# Patient Record
Sex: Female | Born: 2000 | Race: Black or African American | Hispanic: No | Smoking: Former smoker
Health system: Southern US, Community
[De-identification: ages and names within clinical notes are randomized; demographics above are authoritative.]

## PROBLEM LIST (undated history)

## (undated) DIAGNOSIS — R030 Elevated blood-pressure reading, without diagnosis of hypertension: Secondary | ICD-10-CM

## (undated) DIAGNOSIS — Z789 Other specified health status: Secondary | ICD-10-CM

## (undated) HISTORY — DX: Elevated blood-pressure reading, without diagnosis of hypertension: R03.0

## (undated) HISTORY — DX: Other specified health status: Z78.9

## (undated) HISTORY — PX: OTHER SURGICAL HISTORY: SHX169

---
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
## 2020-03-05 ENCOUNTER — Ambulatory Visit: Payer: Medicaid Other

## 2020-04-13 IMAGING — CR DG CHEST 2V
2 series · 2 of 2 positions shown · non-contrast
Comparison: None.

CLINICAL DATA: Chest pain

EXAM:
CHEST - 2 VIEW

[chest pa]
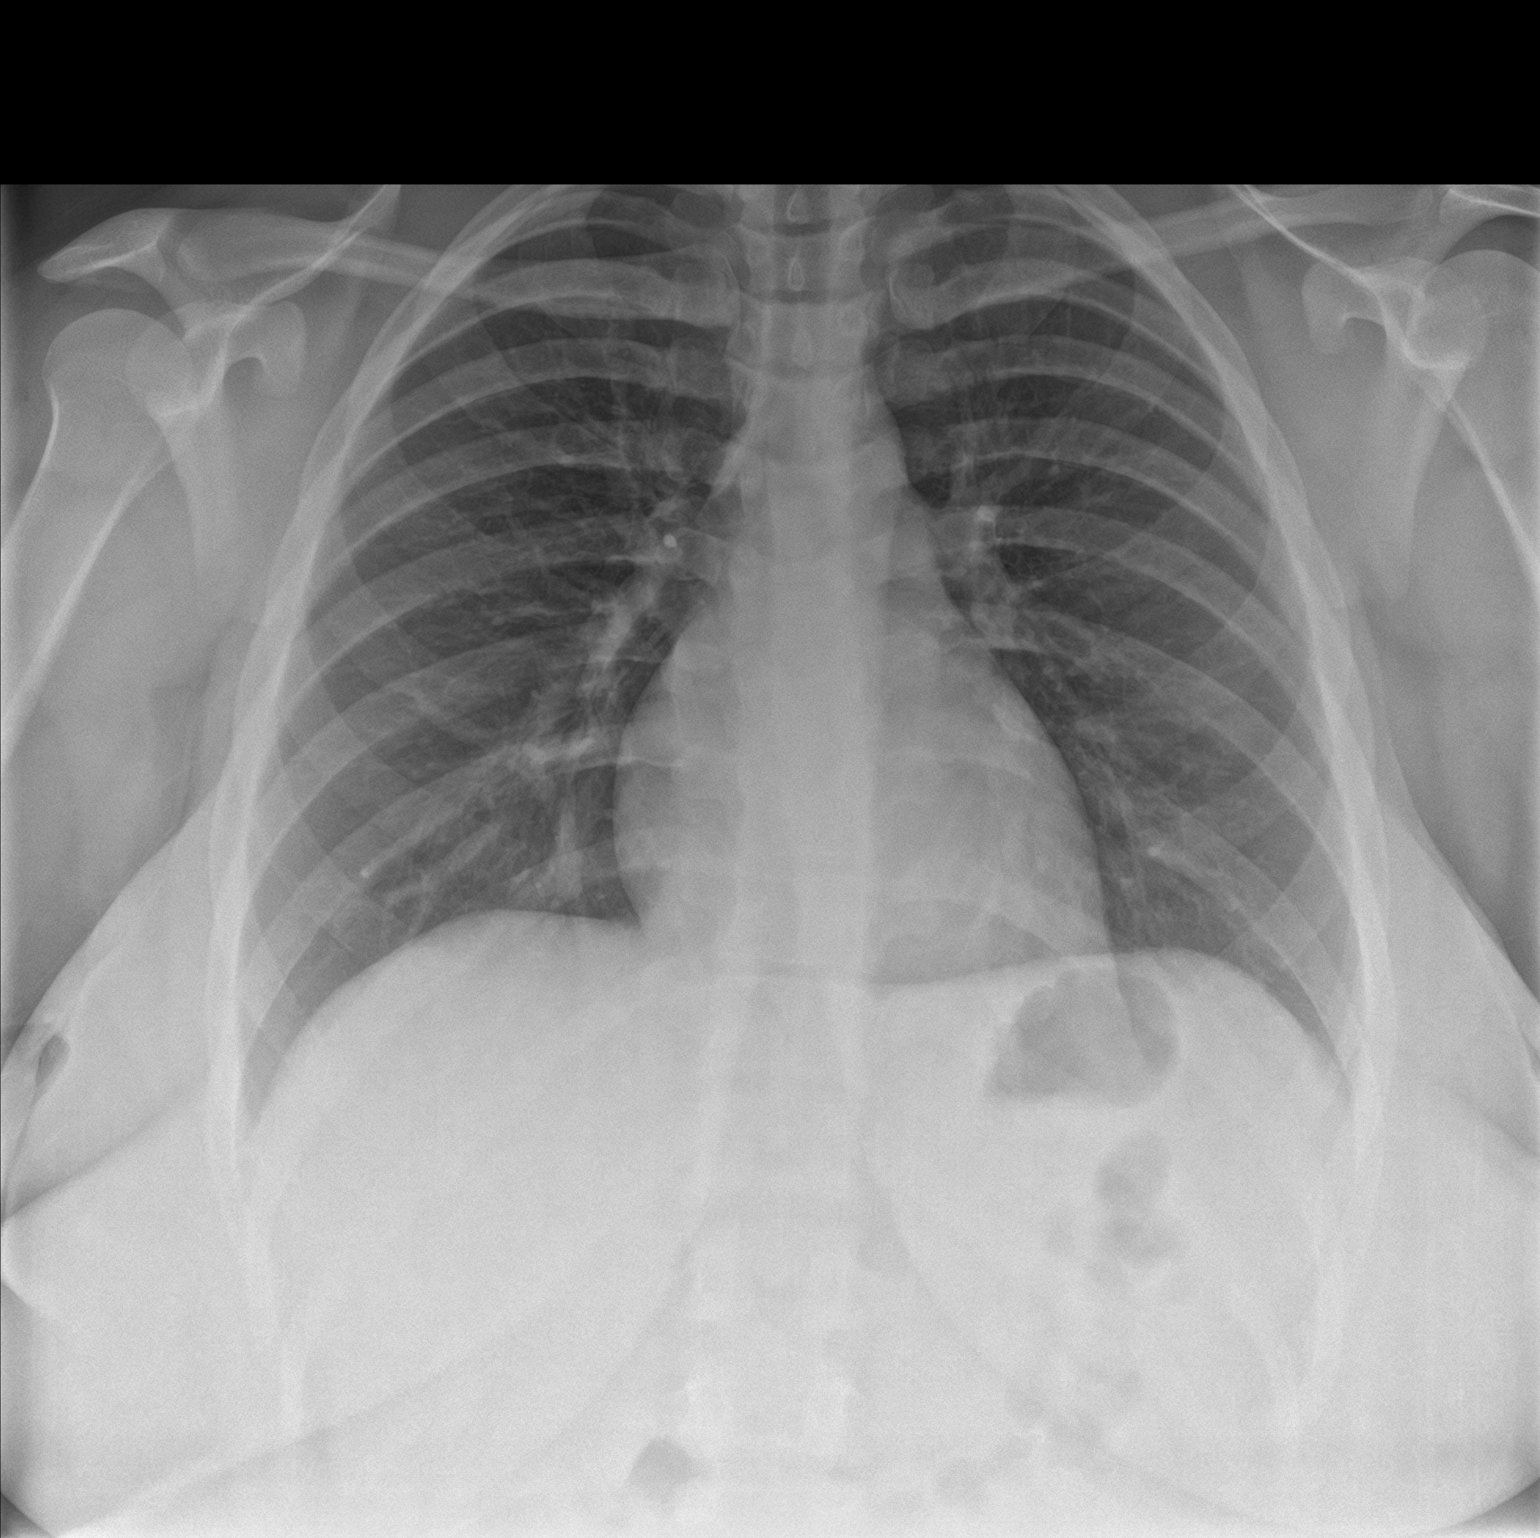

[chest lat]
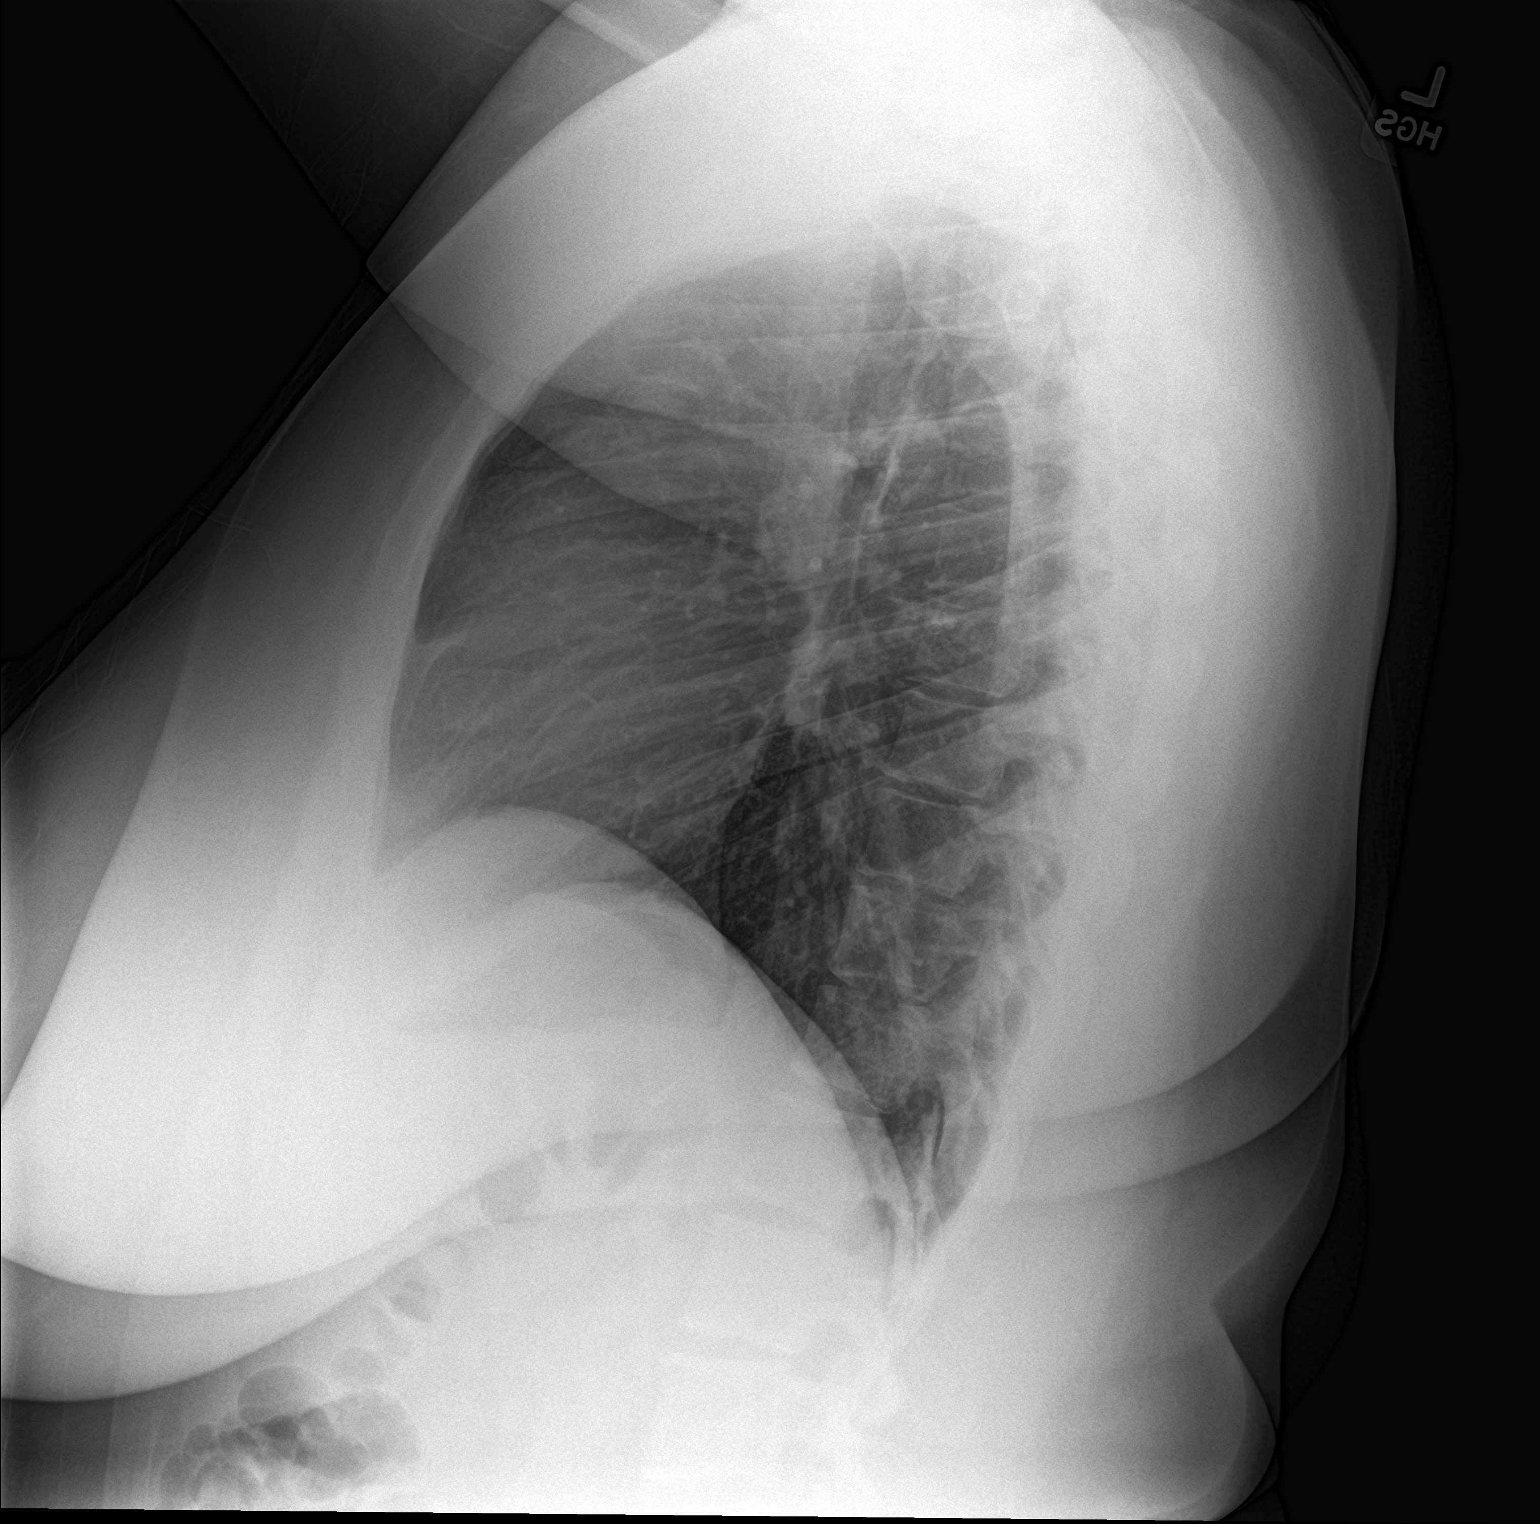

[2 of 2 positions shown; findings below may reference images not displayed]

FINDINGS: The heart size and mediastinal contours are within normal limits.
Both lungs are clear. The visualized skeletal structures are
unremarkable.
IMPRESSION: No active cardiopulmonary disease.

## 2020-11-13 ENCOUNTER — Ambulatory Visit: Payer: Medicaid Other

## 2021-01-22 ENCOUNTER — Other Ambulatory Visit: Payer: Self-pay

## 2021-01-22 ENCOUNTER — Encounter: Payer: Self-pay | Admitting: Emergency Medicine

## 2021-01-22 ENCOUNTER — Emergency Department
Admission: EM | Admit: 2021-01-22 | Discharge: 2021-01-22 | Disposition: A | Discharge status: Home / Self Care | Payer: Medicaid Other | Attending: Emergency Medicine | Admitting: Emergency Medicine

## 2021-01-22 DIAGNOSIS — N760 Acute vaginitis: Secondary | ICD-10-CM | POA: Diagnosis not present

## 2021-01-22 DIAGNOSIS — B9689 Other specified bacterial agents as the cause of diseases classified elsewhere: Secondary | ICD-10-CM

## 2021-01-22 DIAGNOSIS — N898 Other specified noninflammatory disorders of vagina: Secondary | ICD-10-CM | POA: Diagnosis present

## 2021-01-22 LAB — POC URINE PREG, ED: Preg Test, Ur: NEGATIVE

## 2021-01-22 LAB — URINALYSIS, ROUTINE W REFLEX MICROSCOPIC
Bilirubin Urine: NEGATIVE
Glucose, UA: NEGATIVE mg/dL
Ketones, ur: NEGATIVE mg/dL
Nitrite: NEGATIVE
Protein, ur: 30 mg/dL — AB
Specific Gravity, Urine: 1.025 (ref 1.005–1.030)
pH: 5 (ref 5.0–8.0)

## 2021-01-22 LAB — WET PREP, GENITAL
Sperm: NONE SEEN
Trich, Wet Prep: NONE SEEN
WBC, Wet Prep HPF POC: >=10 — AB (ref <10)
Yeast Wet Prep HPF POC: NONE SEEN

## 2021-01-22 LAB — CHLAMYDIA/NGC RT PCR (ARMC ONLY)
Chlamydia Tr: NOT DETECTED
N gonorrhoeae: NOT DETECTED

## 2021-01-22 MED ORDER — METRONIDAZOLE 500 MG PO TABS: 500.0000 mg | ORAL_TABLET | Freq: Two times a day (BID) | ORAL | 0 refills | Status: AC

## 2021-01-22 NOTE — Discharge Instructions (Signed)
Follow-up with your regular doctor, Tracy Hopkins, or return emergency department if your STD testing returns is positive. Take the medication as prescribed. Return if worsening

## 2021-01-22 NOTE — ED Provider Triage Note (Signed)
Emergency Medicine Provider Triage Evaluation Note  Tracy Hopkins , a 21 y.o. female  was evaluated in triage.  Pt complains of .dysuria and pain.  No vaginal discharge.  Saw a small amount of blood on toilet tissue.  Not time for menses.   LLQ pain x 2 months.  No use of birth control,  Review of Systems  Positive: LLQ pain.  Dysuria  Negative:  Physical Exam  There were no vitals taken for this visit. Gen:   Awake, no distress   Resp:  Normal effort.  Lungs clear bilat.  Heart RRR MSK:   Moves extremities without difficulty  Other:  Abdomen soft nontender to palpation.  Bowel sounds normoactive x4 quadrants.  Medical Decision Making  Medically screening exam initiated at 2:44 PM.  Appropriate orders placed.  Tracy Hopkins was informed that the remainder of the evaluation will be completed by another provider, this initial triage assessment does not replace that evaluation, and the importance of remaining in the ED until their evaluation is complete.     Johnn Hai, PA-C 01/22/21 1452

## 2021-01-22 NOTE — ED Triage Notes (Addendum)
C/O vaginal irritation x 2-3 days, worse today.  Today also c/o burning with urination.  Denies vaginal discharge.  Also c/o llq pelvic pain x 2 months

## 2021-01-22 NOTE — ED Provider Notes (Signed)
[facility] Provider Note    Event Date/Time   First MD Initiated Contact with Patient 01/22/21 1530     (approximate)   History   Vaginal Itching   HPI  Tracy Hopkins is a 21 y.o. female presents emergency department complaining of vaginal irritation and itching.  Last period was 12/29/2020.  Patient denies vaginal discharge.  Unsure about STD status.  No fever or chills.  No abdominal pain.      Physical Exam   Triage Vital Signs: ED Triage Vitals  Enc Vitals Group     BP 01/22/21 1449 (!) 148/86     Pulse Rate 01/22/21 1449 71     Resp 01/22/21 1449 16     Temp 01/22/21 1449 98.3 F (36.8 C)     Temp Source 01/22/21 1449 Oral     SpO2 01/22/21 1449 99 %     Weight 01/22/21 1446 270 lb 15.1 oz (122.9 kg)     Height 01/22/21 1446 5\' 8"  (1.727 m)     Head Circumference --      Peak Flow --      Pain Score 01/22/21 1446 0     Pain Loc --      Pain Edu? --      Excl. in GC? --     Most recent vital signs: Vitals:   01/22/21 1449  BP: (!) 148/86  Pulse: 71  Resp: 16  Temp: 98.3 F (36.8 C)  SpO2: 99%     General: Awake, no distress.   CV:  Good peripheral perfusion.   Resp:  Normal effort.   Abd:  No distention.   Other:  GU deferred by the patient   ED Results / Procedures / Treatments   Labs (all labs ordered are listed, but only abnormal results are displayed) Labs Reviewed  WET PREP, GENITAL - Abnormal; Notable for the following components:      Result Value   Clue Cells Wet Prep HPF POC PRESENT (*)    WBC, Wet Prep HPF POC >=10 (*)    All other components within normal limits  URINALYSIS, ROUTINE W REFLEX MICROSCOPIC - Abnormal; Notable for the following components:   Color, Urine YELLOW (*)    APPearance HAZY (*)    Hgb urine dipstick SMALL (*)    Protein, ur 30 (*)    Leukocytes,Ua MODERATE (*)    Bacteria, UA RARE (*)    All other components within normal limits  CHLAMYDIA/NGC RT PCR ([facility] ONLY)             POC URINE PREG, ED     EKG     RADIOLOGY     PROCEDURES:  Critical Care performed: No  Procedures   MEDICATIONS ORDERED IN ED: Medications - No data to display   IMPRESSION / MDM / ASSESSMENT AND PLAN / ED COURSE  I reviewed the triage vital signs and the nursing notes.                              Differential diagnosis includes, but is not limited to, yeast, bacterial vaginosis, STD, pregnancy, UTI  Patient is a 21 year old female with vaginal itching.  See HPI.  Physical exam shows are stable.  POC pregnancy test negative, urinalysis shows moderate leuks, wet prep with GC/chlamydia are pending  Wet prep shows clue cells and greater than 10 WBCs.  This would be consistent with bacterial  vaginosis.  GC/chlamydia is negative.  I did discuss these findings with patient.  She was given a prescription for Flagyl 500 twice daily for 7 days.  She is to follow-up with [facility] department if not improving in 3 to 4 days.  Return emergency department for worsening.  She was discharged in stable condition.  In agreement with the treatment plan       FINAL CLINICAL IMPRESSION(S) / ED DIAGNOSES   Final diagnoses:  Bacterial vaginosis     Rx / DC Orders   ED Discharge Orders          Ordered    metroNIDAZOLE (FLAGYL) 500 MG tablet  2 times daily        01/22/21 1622             Note:  This document was prepared using Dragon voice recognition software and may include unintentional dictation errors.    Faythe Ghee, PA-C 01/22/21 1858    Georga Hacking, MD 01/22/21 Jerene Bears

## 2021-02-04 ENCOUNTER — Other Ambulatory Visit: Payer: Self-pay

## 2021-02-04 ENCOUNTER — Encounter: Payer: Self-pay | Admitting: Emergency Medicine

## 2021-02-04 ENCOUNTER — Emergency Department: Payer: Medicaid Other

## 2021-02-04 DIAGNOSIS — R079 Chest pain, unspecified: Secondary | ICD-10-CM | POA: Diagnosis present

## 2021-02-04 DIAGNOSIS — Z5321 Procedure and treatment not carried out due to patient leaving prior to being seen by health care provider: Secondary | ICD-10-CM | POA: Insufficient documentation

## 2021-02-04 NOTE — ED Triage Notes (Signed)
Patient ambulatory to triage with steady gait, without difficulty or distress noted; pt reports mid CP since last night, nonradiating with no accomp symptoms; denies hx of same, denies any recent illness

## 2021-02-05 ENCOUNTER — Emergency Department
Admission: EM | Admit: 2021-02-05 | Discharge: 2021-02-05 | Disposition: A | Discharge status: Home / Self Care | Payer: Medicaid Other | Attending: Emergency Medicine | Admitting: Emergency Medicine

## 2021-02-05 LAB — BASIC METABOLIC PANEL
Anion gap: 6 (ref 5–15)
BUN: 18 mg/dL (ref 6–20)
CO2: 24 mmol/L (ref 22–32)
Calcium: 9.1 mg/dL (ref 8.9–10.3)
Chloride: 107 mmol/L (ref 98–111)
Creatinine, Ser: 0.95 mg/dL (ref 0.44–1.00)
GFR, Estimated: >60 mL/min (ref >60)
Glucose, Bld: 90 mg/dL (ref 70–99)
Potassium: 3.6 mmol/L (ref 3.5–5.1)
Sodium: 137 mmol/L (ref 135–145)

## 2021-02-05 LAB — TROPONIN I (HIGH SENSITIVITY): Troponin I (High Sensitivity): <2 ng/L (ref <18)

## 2021-02-05 LAB — CBC
HCT: 36.5 % (ref 36.0–46.0)
Hemoglobin: 12 g/dL (ref 12.0–15.0)
MCH: 29.2 pg (ref 26.0–34.0)
MCHC: 32.9 g/dL (ref 30.0–36.0)
MCV: 88.8 fL (ref 80.0–100.0)
Platelets: 265 10*3/uL (ref 150–400)
RBC: 4.11 MIL/uL (ref 3.87–5.11)
RDW: 13.4 % (ref 11.5–15.5)
WBC: 10.9 10*3/uL — ABNORMAL HIGH (ref 4.0–10.5)
nRBC: 0 % (ref 0.0–0.2)

## 2021-02-05 NOTE — ED Notes (Signed)
No answer when called several times from lobby 

## 2021-02-15 ENCOUNTER — Other Ambulatory Visit: Payer: Self-pay

## 2021-02-15 ENCOUNTER — Emergency Department
Admission: EM | Admit: 2021-02-15 | Discharge: 2021-02-15 | Disposition: A | Discharge status: Home / Self Care | Payer: Medicaid Other | Attending: Emergency Medicine | Admitting: Emergency Medicine

## 2021-02-15 DIAGNOSIS — R1084 Generalized abdominal pain: Secondary | ICD-10-CM | POA: Insufficient documentation

## 2021-02-15 DIAGNOSIS — Z20822 Contact with and (suspected) exposure to covid-19: Secondary | ICD-10-CM | POA: Diagnosis not present

## 2021-02-15 LAB — URINALYSIS, ROUTINE W REFLEX MICROSCOPIC
Glucose, UA: NEGATIVE mg/dL
Hgb urine dipstick: NEGATIVE
Nitrite: NEGATIVE
Protein, ur: 30 mg/dL — AB
Specific Gravity, Urine: 1.02 (ref 1.005–1.030)
pH: 7 (ref 5.0–8.0)

## 2021-02-15 LAB — COMPREHENSIVE METABOLIC PANEL
ALT: 13 U/L (ref 0–44)
AST: 14 U/L — ABNORMAL LOW (ref 15–41)
Albumin: 4 g/dL (ref 3.5–5.0)
Alkaline Phosphatase: 70 U/L (ref 38–126)
Anion gap: 8 (ref 5–15)
BUN: 14 mg/dL (ref 6–20)
CO2: 20 mmol/L — ABNORMAL LOW (ref 22–32)
Calcium: 9.2 mg/dL (ref 8.9–10.3)
Chloride: 110 mmol/L (ref 98–111)
Creatinine, Ser: 0.7 mg/dL (ref 0.44–1.00)
GFR, Estimated: >60 mL/min (ref >60)
Glucose, Bld: 92 mg/dL (ref 70–99)
Potassium: 3.5 mmol/L (ref 3.5–5.1)
Sodium: 138 mmol/L (ref 135–145)
Total Bilirubin: 0.5 mg/dL (ref 0.3–1.2)
Total Protein: 7 g/dL (ref 6.5–8.1)

## 2021-02-15 LAB — CBC
HCT: 37.2 % (ref 36.0–46.0)
Hemoglobin: 12.4 g/dL (ref 12.0–15.0)
MCH: 28.9 pg (ref 26.0–34.0)
MCHC: 33.3 g/dL (ref 30.0–36.0)
MCV: 86.7 fL (ref 80.0–100.0)
Platelets: 237 10*3/uL (ref 150–400)
RBC: 4.29 MIL/uL (ref 3.87–5.11)
RDW: 13.2 % (ref 11.5–15.5)
WBC: 7.3 10*3/uL (ref 4.0–10.5)
nRBC: 0 % (ref 0.0–0.2)

## 2021-02-15 LAB — URINALYSIS, MICROSCOPIC (REFLEX): Squamous Epithelial / HPF: >50 (ref 0–5)

## 2021-02-15 LAB — RESP PANEL BY RT-PCR (FLU A&B, COVID) ARPGX2
Influenza A by PCR: NEGATIVE
Influenza B by PCR: NEGATIVE
SARS Coronavirus 2 by RT PCR: NEGATIVE

## 2021-02-15 LAB — POC URINE PREG, ED: Preg Test, Ur: NEGATIVE

## 2021-02-15 LAB — LIPASE, BLOOD: Lipase: 27 U/L (ref 11–51)

## 2021-02-15 MED ORDER — FAMOTIDINE 20 MG PO TABS: 20.0000 mg | ORAL_TABLET | Freq: Two times a day (BID) | ORAL | 0 refills | Status: DC

## 2021-02-15 NOTE — Discharge Instructions (Addendum)
Your blood work was all reassuring.  I suspect that your abdominal discomfort may be related to a viral infection or inflammation of your stomach lining.  You can start taking the Pepcid which is an acid blocker which may help with your pain. Reasons to return to emergency department include worsening pain, inability to eat or drink or pain that is migrating to the right lower quadrant.

## 2021-02-15 NOTE — ED Provider Notes (Signed)
[facility] Provider Note    Event Date/Time   First MD Initiated Contact with Patient 02/15/21 1242     (approximate)   History   Abdominal Pain   HPI  Tracy Hopkins is a 21 y.o. female  pmh of obesity who presents with abdominal pain.  Symptoms started 2 days ago.  Pain is generalized and diffuse.  Not worse in 1 part of her abdomen.  It is intermittent, there are no clear exacerbating or alleviating factors.  She has been eating and drinking, pain is not worse after food intake.  She denies nausea vomiting diarrhea or constipation.  Denies dysuria or abnormal vaginal discharge.  She is not taking anything for the pain.  Has had similar pain in the past and says that it just went away on its own has never been evaluated for it.  She has had a mild cough with minimal congestion.  History reviewed. No pertinent past medical history.  Patient Active Problem List   Diagnosis Date Noted   Morbidly obese (HCC) 271 lbs 11/01/2018     Physical Exam  Triage Vital Signs: ED Triage Vitals [02/15/21 1220]  Enc Vitals Group     BP (!) 140/95     Pulse Rate 78     Resp 18     Temp 100.2 F (37.9 C)     Temp Source Oral     SpO2 99 %     Weight 248 lb (112.5 kg)     Height 5\' 8"  (1.727 m)     Head Circumference      Peak Flow      Pain Score      Pain Loc      Pain Edu?      Excl. in GC?     Most recent vital signs: Vitals:   02/15/21 1220 02/15/21 1333  BP: (!) 140/95 134/78  Pulse: 78 76  Resp: 18 18  Temp: 100.2 F (37.9 C) 99.1 F (37.3 C)  SpO2: 99% 98%     General: Awake, no distress.  CV:  Good peripheral perfusion.  Resp:  Normal effort.  Abd:  No distention.  Nontender to palpation in the epigastric region, no right lower quadrant or right upper quadrant tenderness Neuro:             Awake, Alert, Oriented x 3  Other:     ED Results / Procedures / Treatments  Labs (all labs ordered are listed, but only abnormal results are  displayed) Labs Reviewed  COMPREHENSIVE METABOLIC PANEL - Abnormal; Notable for the following components:      Result Value   CO2 20 (*)    AST 14 (*)    All other components within normal limits  URINALYSIS, ROUTINE W REFLEX MICROSCOPIC - Abnormal; Notable for the following components:   APPearance HAZY (*)    Bilirubin Urine SMALL (*)    Ketones, ur TRACE (*)    Protein, ur 30 (*)    Leukocytes,Ua SMALL (*)    All other components within normal limits  URINALYSIS, MICROSCOPIC (REFLEX) - Abnormal; Notable for the following components:   Bacteria, UA RARE (*)    All other components within normal limits  RESP PANEL BY RT-PCR (FLU A&B, COVID) ARPGX2  LIPASE, BLOOD  CBC  POC URINE PREG, ED     EKG    RADIOLOGY    PROCEDURES:    MEDICATIONS ORDERED IN ED: Medications - No data to display  IMPRESSION / MDM / ASSESSMENT AND PLAN / ED COURSE  I reviewed the triage vital signs and the nursing notes.                              Differential diagnosis includes, but is not limited to, gastritis, pancreatitis, biliary colic, IBS, constipation  21 year old female presents with several days of diffuse intermittent abdominal pain.  Vital signs notable for low-grade temp of 100.2 but otherwise within normal limits.  Patient appears very well on exam she has minimal tenderness to deep palpation in the epigastric region but the right lower quadrant is completely nontender and there is no tenderness in the right upper quadrant.  I reviewed her labs which are overall reassuring, bicarb is mildly low but she has no leukocytosis and LFTs and lipase are within normal limits.  She is not pregnant.  UA is significantly contaminated more there are leuks she has no urinary symptoms do not feel that just represents a UTI.  Given the low-grade temp we will send COVID and influenza testing.  I have low suspicion for surgical pathology including cholecystitis appendicitis given how benign her exam  is.  Suspect gastritis versus IBS.  Will send prescription for Pepcid.  She is otherwise stable for discharge.  Clinical Course as of 02/15/21 1333  Mon Feb 15, 2021  1316 Preg Test, Ur: Negative [KM]    Clinical Course User Index [KM] Georga Hacking, MD     FINAL CLINICAL IMPRESSION(S) / ED DIAGNOSES   Final diagnoses:  Generalized abdominal pain     Rx / DC Orders   ED Discharge Orders          Ordered    famotidine (PEPCID) 20 MG tablet  2 times daily        02/15/21 1327             Note:  This document was prepared using Dragon voice recognition software and may include unintentional dictation errors.   Georga Hacking, MD 02/15/21 [phone]

## 2021-02-15 NOTE — ED Triage Notes (Signed)
Pt c/o generalized abd pain since Saturday, denies N/V/D

## 2021-04-06 ENCOUNTER — Emergency Department
Admission: EM | Admit: 2021-04-06 | Discharge: 2021-04-07 | Disposition: A | Discharge status: Home / Self Care | Payer: Medicaid Other | Attending: Emergency Medicine | Admitting: Emergency Medicine

## 2021-04-06 ENCOUNTER — Other Ambulatory Visit: Payer: Self-pay

## 2021-04-06 DIAGNOSIS — N83202 Unspecified ovarian cyst, left side: Secondary | ICD-10-CM | POA: Insufficient documentation

## 2021-04-06 DIAGNOSIS — R1030 Lower abdominal pain, unspecified: Secondary | ICD-10-CM | POA: Diagnosis present

## 2021-04-06 LAB — COMPREHENSIVE METABOLIC PANEL
ALT: 15 U/L (ref 0–44)
AST: 14 U/L — ABNORMAL LOW (ref 15–41)
Albumin: 4 g/dL (ref 3.5–5.0)
Alkaline Phosphatase: 69 U/L (ref 38–126)
Anion gap: 7 (ref 5–15)
BUN: 19 mg/dL (ref 6–20)
CO2: 23 mmol/L (ref 22–32)
Calcium: 9.4 mg/dL (ref 8.9–10.3)
Chloride: 107 mmol/L (ref 98–111)
Creatinine, Ser: 0.8 mg/dL (ref 0.44–1.00)
GFR, Estimated: >60 mL/min (ref >60)
Glucose, Bld: 120 mg/dL — ABNORMAL HIGH (ref 70–99)
Potassium: 3.6 mmol/L (ref 3.5–5.1)
Sodium: 137 mmol/L (ref 135–145)
Total Bilirubin: 0.6 mg/dL (ref 0.3–1.2)
Total Protein: 7.3 g/dL (ref 6.5–8.1)

## 2021-04-06 LAB — CBC WITH DIFFERENTIAL/PLATELET
Abs Immature Granulocytes: 0.03 10*3/uL (ref 0.00–0.07)
Basophils Absolute: 0 10*3/uL (ref 0.0–0.1)
Basophils Relative: 0 %
Eosinophils Absolute: 0.1 10*3/uL (ref 0.0–0.5)
Eosinophils Relative: 1 %
HCT: 36.7 % (ref 36.0–46.0)
Hemoglobin: 11.9 g/dL — ABNORMAL LOW (ref 12.0–15.0)
Immature Granulocytes: 0 %
Lymphocytes Relative: 24 %
Lymphs Abs: 2.5 10*3/uL (ref 0.7–4.0)
MCH: 28.1 pg (ref 26.0–34.0)
MCHC: 32.4 g/dL (ref 30.0–36.0)
MCV: 86.6 fL (ref 80.0–100.0)
Monocytes Absolute: 0.6 10*3/uL (ref 0.1–1.0)
Monocytes Relative: 6 %
Neutro Abs: 7 10*3/uL (ref 1.7–7.7)
Neutrophils Relative %: 69 %
Platelets: 235 10*3/uL (ref 150–400)
RBC: 4.24 MIL/uL (ref 3.87–5.11)
RDW: 13.5 % (ref 11.5–15.5)
WBC: 10.2 10*3/uL (ref 4.0–10.5)
nRBC: 0 % (ref 0.0–0.2)

## 2021-04-06 LAB — URINALYSIS, ROUTINE W REFLEX MICROSCOPIC
Bacteria, UA: NONE SEEN
Bilirubin Urine: NEGATIVE
Glucose, UA: NEGATIVE mg/dL
Ketones, ur: 20 mg/dL — AB
Nitrite: NEGATIVE
Protein, ur: NEGATIVE mg/dL
Specific Gravity, Urine: 1.031 — ABNORMAL HIGH (ref 1.005–1.030)
pH: 5 (ref 5.0–8.0)

## 2021-04-06 LAB — POC URINE PREG, ED
Preg Test, Ur: NEGATIVE
Preg Test, Ur: NEGATIVE

## 2021-04-06 LAB — LIPASE, BLOOD: Lipase: 28 U/L (ref 11–51)

## 2021-04-06 MED ORDER — ACETAMINOPHEN 500 MG PO TABS
1000.0000 mg | ORAL_TABLET | Freq: Once | ORAL | Status: AC
  Administered 2021-04-06: 1000 mg via ORAL
  Filled 2021-04-06: qty 2

## 2021-04-06 MED ORDER — IBUPROFEN 400 MG PO TABS
400.0000 mg | ORAL_TABLET | Freq: Once | ORAL | Status: AC
Start: 2021-04-06 — End: 2021-04-06
  Administered 2021-04-06: 400 mg via ORAL
  Filled 2021-04-06: qty 1

## 2021-04-06 NOTE — ED Triage Notes (Signed)
Pt unsure if she may be pregnant ?

## 2021-04-06 NOTE — ED Triage Notes (Signed)
Ambulatory to triage. Lower abd pain, onset last week. Cramping in nature. Vaginal Bleeding x 1 hour and not sure if period is early. ?

## 2021-04-06 NOTE — ED Notes (Signed)
Pt reports lower abd pain x1 week.  Pt denies associated n/v/d.  Pt states pain is come and go.  No tenderness on palpation.  Pt does report she started her period today, but pain does not feel like normal period cramps.  Pt A&O x4 in NAD.   ?

## 2021-04-07 ENCOUNTER — Emergency Department: Payer: Medicaid Other

## 2021-04-07 NOTE — ED Provider Notes (Signed)
? ?[facility] ?Provider Note ? ? ? Event Date/Time  ? First MD Initiated Contact with Patient 04/06/21 2319   ?  (approximate) ? ? ?History  ? ?Abdominal Pain ? ? ?HPI ? ?Tracy Hopkins is a 21 y.o. female without significant past medical history presents accompanied by partner for assessment of cramping lower abdominal pain cyst with vaginal bleeding.  Patient states she has had some crampy lower abdominal pain got little worse over the past week.  She states she only developed some vaginal bleeding over the last couple hours.  She states the bleeding is typical of her menstrual period although there is little early as she is expecting her period until the end of this month.  She has not had any other abnormal discharge, burning with urination, back pain, fevers, diarrhea, constipation, earache, sore throat, chest pain, cough, shortness of breath denies any trauma or injuries.  She is not on any medications or birth control.  She has not tried any analgesia for her symptoms.  She does not have an OB/GYN.  No prior similar episodes.  She states that sometimes her periods are irregular. ? ?  ? ? ?Physical Exam  ?Triage Vital Signs: ?ED Triage Vitals [04/06/21 2213]  ?Enc Vitals Group  ?   BP 127/81  ?   Pulse Rate 97  ?   Resp 18  ?   Temp 98.1 ?F (36.7 ?C)  ?   Temp Source Oral  ?   SpO2 99 %  ?   Weight 248 lb (112.5 kg)  ?   Height 5\' 8"  (1.727 m)  ?   Head Circumference   ?   Peak Flow   ?   Pain Score 7  ?   Pain Loc   ?   Pain Edu?   ?   Excl. in GC?   ? ? ?Most recent vital signs: ?Vitals:  ? 04/06/21 2213 04/06/21 2353  ?BP: 127/81 134/76  ?Pulse: 97 93  ?Resp: 18 16  ?Temp: 98.1 ?F (36.7 ?C)   ?SpO2: 99% 99%  ? ? ?General: Awake, no distress.  ?CV:  Good peripheral perfusion.  2+ radial pulses. ?Resp:  Normal effort.  ?Abd:  No distention.  Abdomen soft throughout. ?Other:  No CVA tenderness. ? ? ?ED Results / Procedures / Treatments  ?Labs ?(all labs ordered are listed, but only  abnormal results are displayed) ?Labs Reviewed  ?COMPREHENSIVE METABOLIC PANEL - Abnormal; Notable for the following components:  ?    Result Value  ? Glucose, Bld 120 (*)   ? AST 14 (*)   ? All other components within normal limits  ?CBC WITH DIFFERENTIAL/PLATELET - Abnormal; Notable for the following components:  ? Hemoglobin 11.9 (*)   ? All other components within normal limits  ?URINALYSIS, ROUTINE W REFLEX MICROSCOPIC - Abnormal; Notable for the following components:  ? Color, Urine YELLOW (*)   ? APPearance HAZY (*)   ? Specific Gravity, Urine 1.031 (*)   ? Hgb urine dipstick SMALL (*)   ? Ketones, ur 20 (*)   ? Leukocytes,Ua TRACE (*)   ? All other components within normal limits  ?LIPASE, BLOOD  ?POC URINE PREG, ED  ?POC URINE PREG, ED  ? ? ? ?EKG ? ? ? ? ?RADIOLOGY ? ? ?Pelvic ultrasound reviewed by myself without evidence of torsion or free fluid.  There appears to be normal Doppler flow in both ovaries arterial and venous phases.  I also viewed  radiology capitation and agree with the findings of a 12 cm complex hemorrhagic carpal little cyst on the left but otherwise no acute abnormalities. ? ?PROCEDURES: ? ?Critical Care performed: No ? ?Procedures ? ? ?MEDICATIONS ORDERED IN ED: ?Medications  ?acetaminophen (TYLENOL) tablet 1,000 mg (1,000 mg Oral Given 04/06/21 2348)  ?ibuprofen (ADVIL) tablet 400 mg (400 mg Oral Given 04/06/21 2348)  ? ? ? ?IMPRESSION / MDM / ASSESSMENT AND PLAN / ED COURSE  ?I reviewed the triage vital signs and the nursing notes. ?             ?               ? ?Differential diagnosis includes, but is not limited to torsion, ovarian cyst, abnormal uterine bleeding and some lower crampy pain possibly related to coagulopathy, early menstrual cycle, endometriosis, polyps, fibroids, and ovulatory dysfunction or hormonal imbalance with low suspicion at this time for trauma, acute infectious process or other extrauterine pelvic process such as appendicitis or diverticulitis or kidney  stone. ? ?CMP shows no significant electrolyte or metabolic derangements.  No evidence of hepatitis or cholestatic process.  Lipase not consistent with pancreatitis.  CBC without leukocytosis hemoglobin of 11.9.  Pregnancy test is negative.  UA not suggestive of a cystitis. ? ?Pelvic ultrasound reviewed by myself without evidence of torsion or free fluid.  There appears to be normal Doppler flow in both ovaries arterial and venous phases.  I also viewed radiology capitation and agree with the findings of a 12 cm complex hemorrhagic carpal little cyst on the left but otherwise no acute abnormalities. ? ?I suspect this is likely the etiology of patient's symptoms.  On reassessment she is feeling much better after some Tylenol ibuprofen and her belly is benign.  Low suspicion for any clinically significant hemorrhage.  Discussed follow-up with OB and return for any new or worsening of symptoms.  Discharged in stable condition.  Strict return precautions advised and discussed. ? ?  ? ? ?FINAL CLINICAL IMPRESSION(S) / ED DIAGNOSES  ? ?Final diagnoses:  ?Cyst of left ovary  ? ? ? ?Rx / DC Orders  ? ?ED Discharge Orders   ? ? None  ? ?  ? ? ? ?Note:  This document was prepared using Dragon voice recognition software and may include unintentional dictation errors. ?  ?Gilles Chiquito, MD ?04/07/21 0222 ? ?

## 2021-04-07 NOTE — Discharge Instructions (Signed)
FINDINGS: ?Uterus ?  ?Measurements: 6.4 x 4.4 x 4.6 cm = volume: 67 mL. The uterus is ?retroverted. No focal mass. ?  ?Endometrium ?  ?Thickness: 8 mm.  No focal abnormality visualized. ?  ?Right ovary ?  ?Measurements: 3.0 x 1.5 x 4.1 cm = volume: 5.0 mL. Normal ?appearance/no adnexal mass. ?  ?Left ovary ?  ?Measurements: 4.0 x 2.1 x 1.9 cm = volume: 8.1 mL. There is a 2 cm ?complex/hemorrhagic corpus luteum in the left ovary. ?  ?Pulsed Doppler evaluation of both ovaries demonstrates normal ?low-resistance arterial and venous waveforms. ?  ?Other findings ?  ?No abnormal free fluid. ?  ?IMPRESSION: ?A 2 cm complex/hemorrhagic corpus luteum in the left ovary, ?otherwise unremarkable pelvic ultrasound. ?

## 2022-01-17 NOTE — L&D Delivery Note (Signed)
Delivery Note At  1955, a viable female was delivered vaginally over an intact perineum with several right sided "skid marks" and a small periurethral laceration. The baby presented OA with restitution to LOT. The anterior shoulder was delivered with gentle downward guidance, and the the posterior deivered with gentle upward guidance. The baby was placed on the lower maternal abdomen (short cord).  The respiration s were spontaneous, and a heart rate over 100 noted via the cord pulsation.  APGAR: 8,9 ; weight 5 lbs 10 oz. .   Placenta status: 3 vessel cord ,delivered intact at 2005  .   with the following complications: none .   Anesthesia: Epidural Episiotomy:  none Lacerations:  periurethral laceration Suture Repair:  4.0 vicryl Est. Blood Loss (mL):  450 mls  Mom to postpartum.  Baby to Couplet care / Skin to Skin.  Mirna Mires 11/27/2022, 9:45 PM

## 2022-04-20 ENCOUNTER — Ambulatory Visit (LOCAL_COMMUNITY_HEALTH_CENTER): Payer: Medicaid Other

## 2022-04-20 VITALS — BP 138/75 | Ht 68.0 in | Wt 271.0 lb

## 2022-04-20 DIAGNOSIS — Z3201 Encounter for pregnancy test, result positive: Secondary | ICD-10-CM

## 2022-04-20 LAB — PREGNANCY, URINE: Preg Test, Ur: POSITIVE — AB

## 2022-04-20 MED ORDER — PRENATAL VITAMINS 28-0.8 MG PO TABS: 28.0000 mg | ORAL_TABLET | Freq: Every day | ORAL | 0 refills | Status: AC

## 2022-04-20 NOTE — Progress Notes (Signed)
UPT positive.  Plans prenatal care at [facility].  LMP 03/14/22 but light and only a few days.  States LNMP around 02/11/22.  Positive pregnancy packet given and reviewed with pt.  The patient was dispensed prenatal vitamins today.  Patient given the opportunity to ask questions. Questions answered.   Juleen China FNP re: descrepancy in LMP dates/gestational age per LMPs.  She recommended pt receive new OB appt in 2 weeks or soon after.  Walked pt to clerk for presumptive eligibility and new OB appt.  ((Appt scheduled for 05/09/22) Tonny Branch, RN

## 2022-05-06 DIAGNOSIS — O0990 Supervision of high risk pregnancy, unspecified, unspecified trimester: Secondary | ICD-10-CM | POA: Insufficient documentation

## 2022-05-06 DIAGNOSIS — Z3400 Encounter for supervision of normal first pregnancy, unspecified trimester: Secondary | ICD-10-CM | POA: Insufficient documentation

## 2022-05-09 ENCOUNTER — Ambulatory Visit: Payer: Medicaid Other | Admitting: Family Medicine

## 2022-05-09 ENCOUNTER — Encounter: Payer: Self-pay | Admitting: Family Medicine

## 2022-05-09 VITALS — BP 138/91 | HR 80 | Temp 97.7°F | Wt 270.4 lb

## 2022-05-09 DIAGNOSIS — O99211 Obesity complicating pregnancy, first trimester: Secondary | ICD-10-CM

## 2022-05-09 DIAGNOSIS — O169 Unspecified maternal hypertension, unspecified trimester: Secondary | ICD-10-CM | POA: Insufficient documentation

## 2022-05-09 DIAGNOSIS — O161 Unspecified maternal hypertension, first trimester: Secondary | ICD-10-CM

## 2022-05-09 DIAGNOSIS — Z3401 Encounter for supervision of normal first pregnancy, first trimester: Secondary | ICD-10-CM

## 2022-05-09 DIAGNOSIS — Z3400 Encounter for supervision of normal first pregnancy, unspecified trimester: Secondary | ICD-10-CM

## 2022-05-09 DIAGNOSIS — Z23 Encounter for immunization: Secondary | ICD-10-CM | POA: Diagnosis not present

## 2022-05-09 LAB — WET PREP FOR TRICH, YEAST, CLUE
Trichomonas Exam: NEGATIVE
Yeast Exam: NEGATIVE

## 2022-05-09 LAB — HEMOGLOBIN, FINGERSTICK: Hemoglobin: 11 g/dL — ABNORMAL LOW (ref 11.1–15.9)

## 2022-05-09 NOTE — Progress Notes (Addendum)
Presents for initiation of prenatal care and denies medical care thus far in pregnancy. Reports LNMP was 02/11/2022 and LMP was 03/14/2022 (lighter flow, fewer days). Counseled on flu vaccine recommendation in pregnancy and vaccine accepted. Tolerated injection without complaint. Currently taking PNV QOD and counseled to take QD. Understanding verbalized. Jossie Ng, RN [facility] referral for dating [place] faxed with [employer]. [facility] transfer of care referral due to chronic hypertension faxed with [employer] pages / initial encounter documentation. Fax confirmation received for both of above. Client counseled [facility] would contact her with appts via phone call. Jossie Ng, RN Wet prep reviewed - negative results. Hgb = 11.0. No intervention required per standing order. Jossie Ng, RN

## 2022-05-09 NOTE — Progress Notes (Signed)
[facility]  Maternal Health Clinic   INITIAL PRENATAL VISIT NOTE  Subjective:  Tracy Hopkins is a 22 y.o. G1P0000 at [redacted]w[redacted]d being seen today to start prenatal care at the [facility].  She is currently monitored for the following issues for this high-risk pregnancy and has Maternal morbid obesity, antepartum; Supervision of normal first pregnancy, antepartum; and Elevated blood pressure affecting pregnancy, antepartum on their problem list.  Patient reports no complaints.  Contractions: Not present. Vag. Bleeding: None.  Movement: Absent. Denies leaking of fluid.   Indications for ASA therapy (per uptodate) One of the following: Previous pregnancy with preeclampsia, especially early onset and with an adverse outcome No Multifetal gestation No Chronic hypertension No Type 1 or 2 diabetes mellitus No Chronic kidney disease No Autoimmune disease (antiphospholipid syndrome, systemic lupus erythematosus) No  Two or more of the following: Nulliparity Yes Obesity (body mass index >30 kg/m2) Yes Family history of preeclampsia in mother or sister No Age ?35 years No Sociodemographic characteristics (African American race, low socioeconomic level) Yes Personal risk factors (eg, previous pregnancy with low birth weight or small for gestational age infant, previous adverse pregnancy outcome [eg, stillbirth], interval >10 years between pregnancies) No   The following portions of the patient's history were reviewed and updated as appropriate: allergies, current medications, past family history, past medical history, past social history, past surgical history and problem list. Problem list updated.  Objective:   Vitals:   05/09/22 1347 05/09/22 1421  BP: 129/86 (!) 138/91  Pulse: 80   Temp: 97.7 F (36.5 C)   Weight: 270 lb 6.4 oz (122.7 kg)     Fetal Status:     Movement: Absent      Physical Exam Vitals and nursing note reviewed.   Constitutional:      General: She is not in acute distress.    Appearance: Normal appearance. She is well-developed.  HENT:     Head: Normocephalic and atraumatic.     Right Ear: External ear normal.     Left Ear: External ear normal.     Nose: Nose normal. No congestion or rhinorrhea.     Mouth/Throat:     Lips: Pink.     Mouth: Mucous membranes are moist.     Dentition: Normal dentition. No dental caries.     Pharynx: Oropharynx is clear. Uvula midline.     Comments: Dentition: fair Eyes:     General: No scleral icterus.    Conjunctiva/sclera: Conjunctivae normal.  Neck:     Thyroid: No thyroid mass or thyromegaly.  Cardiovascular:     Rate and Rhythm: Normal rate.     Pulses: Normal pulses.     Comments: Extremities are warm and well perfused Pulmonary:     Effort: Pulmonary effort is normal.     Breath sounds: Normal breath sounds.  Chest:     Chest wall: No mass.  Breasts:    Tanner Score is 5.     Breasts are symmetrical.     Right: Normal. No mass, nipple discharge or skin change.     Left: Normal. No mass, nipple discharge or skin change.  Abdominal:     General: Abdomen is flat.     Palpations: Abdomen is soft.     Tenderness: There is no abdominal tenderness.     Comments: Gravid   Genitourinary:    General: Normal vulva.     Exam position: Lithotomy position.     Pubic Area:  No rash.      Labia:        Right: No rash.        Left: No rash.      Vagina: Normal. No vaginal discharge.     Cervix: No cervical motion tenderness or friability.     Uterus: Normal. Not enlarged (Gravid 10-12 size) and not tender.      Adnexa: Right adnexa normal and left adnexa normal.     Rectum: Normal. No external hemorrhoid.     Comments: Unable to palpate uterus Musculoskeletal:     Right lower leg: No edema.     Left lower leg: No edema.  Lymphadenopathy:     Cervical: No cervical adenopathy.     Upper Body:     Right upper body: No axillary adenopathy.     Left  upper body: No axillary adenopathy.  Skin:    General: Skin is warm.     Capillary Refill: Capillary refill takes less than 2 seconds.  Neurological:     Mental Status: She is alert.     Assessment and Plan:  Pregnancy: G1P0000 at [redacted]w[redacted]d  1. Supervision of normal first pregnancy, antepartum 22 year old female in clinic today for prenatal care. Patient and partner feel surprised about the pregnancy. Endorses MJ and Etoh in March prior to finding out she was pregnant- counseled not to use during pregnancy. Pt states she was using for special occasions like someone's birthday. Last Etoh March 23, and last MJ 3/30.  UDS performed today.   -Patient states last LMP 02/11/22, but then had some spotting 03/14/22. Uterus not palpable on exam, FHR not heard today. dating U/S submitted.  -Patient states she is taking PNV daily. -Patient reports little nausea and vomiting.  Patient encouraged to eat meals high in protein every 2 hours for nausea and also given resource sheet. Will continue to monitor.   -PAP= never, done today -Patient received dental care few years ago.  Patient given dental information.   -Agrees to Maternti21- though due to possibly being 8 weeks today, will do at next appt  -Patient declined COVID vaccine. Patient to receive Flu vaccine today. -PHQ-9= 4, will continue to monitor.  -Hgb= wnl   - WET PREP FOR TRICH, YEAST, CLUE - Hemoglobin, fingerstick - [id number] 7+Oxycodone-Bund - Prenatal Profile I - Hgb Fractionation Cascade - Glucose, 1 hour - Hgb A1c w/o eAG - Comprehensive metabolic panel - TSH - Protein / creatinine ratio, urine - IGP, rfx Aptima HPV ASCU  2. Maternal morbid obesity, antepartum -ASA recommendation discussed  accepts and agrees to buy OTC and start taking.  -total weight gain of 11-20# discussed today  3. Hypertension affecting pregnancy in first trimester Today's BP 129/86, repeat 138/91 Other values: 04/20/22- 138/75 04/06/21- 127/81 02/15/21-  140/95 11/01/18- 149/94  Discussed with patient that she likely has chronic HTN and per clinic guidelines- transfer of care indicated. Pt would like to have [facility] as primary care for this pregnancy. RN to fax consult request.   Discussed overview of care and coordination with inpatient delivery practices including [facility],  [facility].   Preterm labor symptoms and general obstetric precautions including but not limited to vaginal bleeding, contractions, leaking of fluid and fetal movement were reviewed in detail with the patient.  Please refer to After Visit Summary for other counseling recommendations.   Return in about 4 weeks (around 06/06/2022) for Routine Prenatal Care.  No future appointments.  Lenice Llamas, [place]

## 2022-05-12 LAB — PROTEIN / CREATININE RATIO, URINE
Creatinine, Urine: 272.9 mg/dL
Protein, Ur: 25.7 mg/dL
Protein/Creat Ratio: 94 mg/g creat (ref 0–200)

## 2022-05-12 LAB — 789231 7+OXYCODONE-BUND
Amphetamines, Urine: NEGATIVE ng/mL
BENZODIAZ UR QL: NEGATIVE ng/mL
Barbiturate screen, urine: NEGATIVE ng/mL
Cocaine (Metab.): NEGATIVE ng/mL
OPIATE SCREEN URINE: NEGATIVE ng/mL
Oxycodone/Oxymorphone, Urine: NEGATIVE ng/mL
PCP Quant, Ur: NEGATIVE ng/mL

## 2022-05-12 LAB — CANNABINOID CONFIRMATION, UR
CANNABINOIDS: POSITIVE — AB
Carboxy THC GC/MS Conf: 519 ng/mL

## 2022-05-13 ENCOUNTER — Telehealth: Payer: Self-pay

## 2022-05-13 ENCOUNTER — Encounter: Payer: Self-pay | Admitting: Family Medicine

## 2022-05-13 DIAGNOSIS — F1290 Cannabis use, unspecified, uncomplicated: Secondary | ICD-10-CM | POA: Insufficient documentation

## 2022-05-13 DIAGNOSIS — O99320 Drug use complicating pregnancy, unspecified trimester: Secondary | ICD-10-CM | POA: Insufficient documentation

## 2022-05-13 DIAGNOSIS — O99810 Abnormal glucose complicating pregnancy: Secondary | ICD-10-CM | POA: Insufficient documentation

## 2022-05-13 LAB — COMPREHENSIVE METABOLIC PANEL
ALT: 14 IU/L (ref 0–32)
AST: 10 IU/L (ref 0–40)
Albumin/Globulin Ratio: 1.6 (ref 1.2–2.2)
Albumin: 3.9 g/dL — ABNORMAL LOW (ref 4.0–5.0)
Alkaline Phosphatase: 72 IU/L (ref 44–121)
BUN/Creatinine Ratio: 11 (ref 9–23)
BUN: 6 mg/dL (ref 6–20)
Bilirubin Total: 0.3 mg/dL (ref 0.0–1.2)
CO2: 17 mmol/L — ABNORMAL LOW (ref 20–29)
Calcium: 9 mg/dL (ref 8.7–10.2)
Chloride: 104 mmol/L (ref 96–106)
Creatinine, Ser: 0.57 mg/dL (ref 0.57–1.00)
Globulin, Total: 2.5 g/dL (ref 1.5–4.5)
Glucose: 144 mg/dL — ABNORMAL HIGH (ref 70–99)
Potassium: 3.8 mmol/L (ref 3.5–5.2)
Sodium: 135 mmol/L (ref 134–144)
Total Protein: 6.4 g/dL (ref 6.0–8.5)
eGFR: 132 mL/min/{1.73_m2} (ref >59)

## 2022-05-13 LAB — PREGNANCY, INITIAL SCREEN
Antibody Screen: NEGATIVE
Basophils Absolute: 0 10*3/uL (ref 0.0–0.2)
Basos: 0 %
Bilirubin, UA: NEGATIVE
Chlamydia trachomatis, NAA: NEGATIVE
EOS (ABSOLUTE): 0.1 10*3/uL (ref 0.0–0.4)
Eos: 1 %
Glucose, UA: NEGATIVE
HCV Ab: NONREACTIVE
HIV Screen 4th Generation wRfx: NONREACTIVE
Hematocrit: 33.9 % — ABNORMAL LOW (ref 34.0–46.6)
Hemoglobin: 11.3 g/dL (ref 11.1–15.9)
Hepatitis B Surface Ag: NEGATIVE
Immature Grans (Abs): 0 10*3/uL (ref 0.0–0.1)
Immature Granulocytes: 0 %
Lymphocytes Absolute: 1.8 10*3/uL (ref 0.7–3.1)
Lymphs: 18 %
MCH: 29.1 pg (ref 26.6–33.0)
MCHC: 33.3 g/dL (ref 31.5–35.7)
MCV: 87 fL (ref 79–97)
Monocytes Absolute: 0.4 10*3/uL (ref 0.1–0.9)
Monocytes: 4 %
Neisseria Gonorrhoeae by PCR: NEGATIVE
Neutrophils Absolute: 7.4 10*3/uL — ABNORMAL HIGH (ref 1.4–7.0)
Neutrophils: 77 %
Nitrite, UA: NEGATIVE
Platelets: 279 10*3/uL (ref 150–450)
RBC, UA: NEGATIVE
RBC: 3.88 x10E6/uL (ref 3.77–5.28)
RDW: 12.7 % (ref 11.7–15.4)
RPR Ser Ql: NONREACTIVE
Rh Factor: POSITIVE
Rubella Antibodies, IGG: 7.31 index (ref >0.99)
Specific Gravity, UA: 1.024 (ref 1.005–1.030)
Urobilinogen, Ur: 1 mg/dL (ref 0.2–1.0)
WBC: 9.6 10*3/uL (ref 3.4–10.8)
pH, UA: 6.5 (ref 5.0–7.5)

## 2022-05-13 LAB — HGB A1C W/O EAG: Hgb A1c MFr Bld: 5.2 % (ref 4.8–5.6)

## 2022-05-13 LAB — MICROSCOPIC EXAMINATION
Casts: NONE SEEN /lpf
RBC, Urine: NONE SEEN /hpf (ref 0–2)

## 2022-05-13 LAB — HGB FRACTIONATION CASCADE
Hgb A2: 2.6 % (ref 1.8–3.2)
Hgb A: 97.4 % (ref 96.4–98.8)
Hgb F: 0 % (ref 0.0–2.0)
Hgb S: 0 %

## 2022-05-13 LAB — GLUCOSE, 1 HOUR GESTATIONAL: Gestational Diabetes Screen: 138 mg/dL (ref 70–139)

## 2022-05-13 LAB — URINE CULTURE, OB REFLEX

## 2022-05-13 LAB — TSH: TSH: 1.62 u[IU]/mL (ref 0.450–4.500)

## 2022-05-13 LAB — HCV INTERPRETATION

## 2022-05-13 NOTE — Telephone Encounter (Signed)
Attempted to call patient to schedule 3 HR GTT. Phone not taking messages. Called emergency contact and received message that phone was disconnected.

## 2022-05-14 LAB — IGP, RFX APTIMA HPV ASCU: PAP Smear Comment: 0

## 2022-05-16 NOTE — Telephone Encounter (Signed)
Lengthy message sent to R. Marlan Palau MSW, [facility] Care Manager regarding last 2 phone entries today. Per Ms. Marlan Palau, she will home visit client in the am. Jossie Ng, RN

## 2022-05-16 NOTE — Telephone Encounter (Signed)
Per secure chat from R. Marlan Palau, able to contact client and provided her with all information this RN requested she share with client. Please leave phone note open until verify client has 3 hour GTT scheduled and [facility] transfer of care appt. Jossie Ng, RN

## 2022-05-16 NOTE — Telephone Encounter (Signed)
Call to client as needs 1) 3 hr GTT and 2) needs transfer of care appt at [facility] due to likely chronic HTN. [facility] has attempted to call her unsuccessfully x3.  Call to client and phone not accepting calls at this time per recorded message. Call to emergency contact (father) and per female, he got this number today and is not Mr. Beebe. Call to emergency contact (grandmother) and left message requesting assistance contacting client to give her number for [facility] as too high risk for prenatal care at [facility]. Number for [facility] left on message. Also left [facility] number and requested client call [facility]. Jossie Ng, RN

## 2022-05-16 NOTE — Telephone Encounter (Signed)
This woman, Chyrl Civatte, left a message on R. Spruill's phone stating she does not know this client. She states her name is Martyn Malay and her number is [phone], and that she has received 3 phone calls about this client. In her message she states "don't know no Denaya". She also states she wishes she could help [place] reach the client but she doesn't know the client and doesn't know anyone named Rosaria. She asked that we not call her number again.Burt Knack, RN

## 2022-05-20 NOTE — Telephone Encounter (Signed)
Client has [facility] new OB (transfer of care) appt scheduled for 05/24/2022.  Call to client this am to schedule 3 hour GTT testing on 05/23/2022 )and when results available 05/24/2022 am, [facility] can fax to [facility]). Left message to call regarding above and number to call provided. Jossie Ng, RN

## 2022-06-21 NOTE — Addendum Note (Signed)
Addended by: Heywood Bene on: 06/21/2022 01:50 PM   Modules accepted: Orders

## 2022-07-02 ENCOUNTER — Emergency Department
Admission: EM | Admit: 2022-07-02 | Discharge: 2022-07-02 | Disposition: A | Discharge status: Home / Self Care | Payer: Medicaid Other | Attending: Emergency Medicine | Admitting: Emergency Medicine

## 2022-07-02 ENCOUNTER — Other Ambulatory Visit: Payer: Self-pay

## 2022-07-02 DIAGNOSIS — Z3A15 15 weeks gestation of pregnancy: Secondary | ICD-10-CM | POA: Insufficient documentation

## 2022-07-02 DIAGNOSIS — R8271 Bacteriuria: Secondary | ICD-10-CM | POA: Insufficient documentation

## 2022-07-02 DIAGNOSIS — D649 Anemia, unspecified: Secondary | ICD-10-CM | POA: Diagnosis not present

## 2022-07-02 DIAGNOSIS — O26892 Other specified pregnancy related conditions, second trimester: Secondary | ICD-10-CM | POA: Insufficient documentation

## 2022-07-02 LAB — COMPREHENSIVE METABOLIC PANEL
ALT: 22 U/L (ref 0–44)
AST: 21 U/L (ref 15–41)
Albumin: 3.6 g/dL (ref 3.5–5.0)
Alkaline Phosphatase: 54 U/L (ref 38–126)
Anion gap: 9 (ref 5–15)
BUN: 9 mg/dL (ref 6–20)
CO2: 17 mmol/L — ABNORMAL LOW (ref 22–32)
Calcium: 8.9 mg/dL (ref 8.9–10.3)
Chloride: 107 mmol/L (ref 98–111)
Creatinine, Ser: 0.49 mg/dL (ref 0.44–1.00)
GFR, Estimated: >60 mL/min (ref >60)
Glucose, Bld: 97 mg/dL (ref 70–99)
Potassium: 3.7 mmol/L (ref 3.5–5.1)
Sodium: 133 mmol/L — ABNORMAL LOW (ref 135–145)
Total Bilirubin: 0.4 mg/dL (ref 0.3–1.2)
Total Protein: 6.7 g/dL (ref 6.5–8.1)

## 2022-07-02 LAB — URINALYSIS, ROUTINE W REFLEX MICROSCOPIC
Bilirubin Urine: NEGATIVE
Glucose, UA: NEGATIVE mg/dL
Hgb urine dipstick: NEGATIVE
Ketones, ur: NEGATIVE mg/dL
Nitrite: NEGATIVE
Protein, ur: 30 mg/dL — AB
Specific Gravity, Urine: 1.024 (ref 1.005–1.030)
pH: 5 (ref 5.0–8.0)

## 2022-07-02 LAB — CBC WITH DIFFERENTIAL/PLATELET
Abs Immature Granulocytes: 0.03 10*3/uL (ref 0.00–0.07)
Basophils Absolute: 0 10*3/uL (ref 0.0–0.1)
Basophils Relative: 0 %
Eosinophils Absolute: 0.1 10*3/uL (ref 0.0–0.5)
Eosinophils Relative: 1 %
HCT: 33.4 % — ABNORMAL LOW (ref 36.0–46.0)
Hemoglobin: 11.1 g/dL — ABNORMAL LOW (ref 12.0–15.0)
Immature Granulocytes: 0 %
Lymphocytes Relative: 17 %
Lymphs Abs: 1.6 10*3/uL (ref 0.7–4.0)
MCH: 28.8 pg (ref 26.0–34.0)
MCHC: 33.2 g/dL (ref 30.0–36.0)
MCV: 86.5 fL (ref 80.0–100.0)
Monocytes Absolute: 0.5 10*3/uL (ref 0.1–1.0)
Monocytes Relative: 5 %
Neutro Abs: 7.4 10*3/uL (ref 1.7–7.7)
Neutrophils Relative %: 77 %
Platelets: 253 10*3/uL (ref 150–400)
RBC: 3.86 MIL/uL — ABNORMAL LOW (ref 3.87–5.11)
RDW: 12.8 % (ref 11.5–15.5)
WBC: 9.6 10*3/uL (ref 4.0–10.5)
nRBC: 0 % (ref 0.0–0.2)

## 2022-07-02 LAB — POC URINE PREG, ED: Preg Test, Ur: POSITIVE — AB

## 2022-07-02 LAB — LIPASE, BLOOD: Lipase: 26 U/L (ref 11–51)

## 2022-07-02 MED ORDER — CEPHALEXIN 500 MG PO CAPS: 500.0000 mg | ORAL_CAPSULE | Freq: Two times a day (BID) | ORAL | 0 refills | Status: AC

## 2022-07-02 MED ORDER — CEPHALEXIN 500 MG PO CAPS
500.0000 mg | ORAL_CAPSULE | Freq: Once | ORAL | Status: AC
  Administered 2022-07-02: 500 mg via ORAL
  Filled 2022-07-02: qty 1

## 2022-07-02 NOTE — Discharge Instructions (Addendum)
You have some bacteria in your urine.  Please take the antibiotic twice a day for the next 7 days.  You can take over-the-counter MiraLAX if you are feeling constipated.  Please follow-up with OB/GYN.  You can call the number above to schedule an appointment.

## 2022-07-02 NOTE — ED Triage Notes (Signed)
Patient states upper abdominal pain and vomiting x 1 since 1100, happened after eating a granola bar. Reports she is [redacted] weeks pregnant.

## 2022-07-02 NOTE — ED Provider Notes (Signed)
[facility] Provider Note    Event Date/Time   First MD Initiated Contact with Patient 07/02/22 1412     (approximate)   History   Abdominal Pain   HPI  Tracy Hopkins is a 22 y.o. female G1, P0 approximately [redacted] weeks pregnant who presents with abdominal pain.  This morning patient was lying down when she felt pain in her bilateral upper quadrants and epigastric region.  Lasted about 30 minutes.  She felt she needed to have a bowel movement.  Did not have nausea or vomiting.  This is since subsided and she has no ongoing pain.  Patient has been constipated no BM in last 3 days typically goes daily.  Says she has been eating a lot of cheese.  Patient's last menstrual period was February 26.  [facility] but has not yet seen an OB/GYN.  No known complications of this pregnancy although she has had some elevated blood pressure.  Has never been pregnant before.  Denies any dyspnea or chest pain.  Vaginal bleeding.     Past Medical History:  Diagnosis Date   Elevated BP without diagnosis of hypertension    Patient denies medical problems     Patient Active Problem List   Diagnosis Date Noted   Marijuana use during pregnancy; +UDS MJ 05/09/22 05/13/2022   Abnormal glucose in pregnancy, antepartum 05/13/2022   Elevated blood pressure affecting pregnancy, antepartum 05/09/2022   Supervision of normal first pregnancy, antepartum 05/06/2022   Maternal morbid obesity, antepartum (HCC) 11/01/2018     Physical Exam  Triage Vital Signs: ED Triage Vitals [07/02/22 1345]  Enc Vitals Group     BP (!) 143/83     Pulse Rate 86     Resp 18     Temp 99.1 F (37.3 C)     Temp Source Oral     SpO2 100 %     Weight 265 lb (120.2 kg)     Height 5\' 8"  (1.727 m)     Head Circumference      Peak Flow      Pain Score 8     Pain Loc      Pain Edu?      Excl. in GC?     Most recent vital signs: Vitals:   07/02/22 1345  BP: (!)  143/83  Pulse: 86  Resp: 18  Temp: 99.1 F (37.3 C)  SpO2: 100%     General: Awake, no distress.  CV:  Good peripheral perfusion.  Resp:  Normal effort.  Abd:  No distention.  Abdomen is soft nontender throughout Neuro:             Awake, Alert, Oriented x 3  Other:     ED Results / Procedures / Treatments  Labs (all labs ordered are listed, but only abnormal results are displayed) Labs Reviewed  COMPREHENSIVE METABOLIC PANEL - Abnormal; Notable for the following components:      Result Value   Sodium 133 (*)    CO2 17 (*)    All other components within normal limits  CBC WITH DIFFERENTIAL/PLATELET - Abnormal; Notable for the following components:   RBC 3.86 (*)    Hemoglobin 11.1 (*)    HCT 33.4 (*)    All other components within normal limits  URINALYSIS, ROUTINE W REFLEX MICROSCOPIC - Abnormal; Notable for the following components:   Color, Urine YELLOW (*)    APPearance HAZY (*)  Protein, ur 30 (*)    Leukocytes,Ua LARGE (*)    Bacteria, UA RARE (*)    All other components within normal limits  POC URINE PREG, ED - Abnormal; Notable for the following components:   Preg Test, Ur Positive (*)    All other components within normal limits  URINE CULTURE  LIPASE, BLOOD     EKG     RADIOLOGY I performed interpreted bedside ultrasound which shows good fetal movement, fetal heart rate 150, estimated date by [facility] is 15 weeks and 6 days.   PROCEDURES:  Critical Care performed: No  Procedures    MEDICATIONS ORDERED IN ED: Medications  cephALEXin (KEFLEX) capsule 500 mg (500 mg Oral Given 07/02/22 1503)     IMPRESSION / MDM / ASSESSMENT AND PLAN / ED COURSE  I reviewed the triage vital signs and the nursing notes.                              Patient's presentation is most consistent with acute complicated illness / injury requiring diagnostic workup.  Differential diagnosis includes, but is not limited to, constipation, GERD, gastritis, biliary  colic, pancreatitis  22 year old female approximately [redacted] weeks pregnant who presents because of an episode of upper abdominal pain.  This started this morning describes pain under both ribs and epigastric region.  Lasted for about 30 minutes and has since resolved.  Has been constipated and felt she needed to have a BM today but was not able to.  No nausea vomiting no urinary symptoms no vaginal bleeding.  She has been seen at the health department 2 months ago but has not seen an OB/GYN yet.  Patient is mildly hypertensive vitals otherwise reassuring.  She looks well abdominal exam is benign she is asymptomatic at this time.  Performed bedside ultrasound which shows good fetal movement fetal heart rate 150 and estimated date 5 biparietal diameter 15 weeks and 6 days.  Patient's labs overall reassuring.  Sodium just slightly low at 133 CO2 low at 17 mild anemia of 11.1 not unexpected for pregnancy.  No leukocytosis normal lipase and LFTs.  Given patient is asymptomatic do not feel that she needs further workup at this time.  Suspect this could be constipation related.  Recommended OTC MiraLAX.  Patient's urinalysis did have a good amount of pyuria and few bacteria.  I sent for urine culture.  Will start her on Keflex for presumed asymptomatic bacteriuria.       FINAL CLINICAL IMPRESSION(S) / ED DIAGNOSES   Final diagnoses:  Asymptomatic bacteriuria     Rx / DC Orders   ED Discharge Orders          Ordered    cephALEXin (KEFLEX) 500 MG capsule  2 times daily        07/02/22 1458             Note:  This document was prepared using Dragon voice recognition software and may include unintentional dictation errors.   Georga Hacking, MD 07/02/22 1620

## 2022-07-03 LAB — URINE CULTURE

## 2022-07-04 LAB — URINE CULTURE: Culture: 70000 — AB

## 2022-07-29 ENCOUNTER — Ambulatory Visit: Payer: Medicaid Other

## 2022-07-29 NOTE — Telephone Encounter (Signed)
Reached out to pt to reschedule [facility] Nurse Intake Appt that was scheduled on 07/29/2022 at 11:15 with  [facility].  Was able to reschedule appt for 08/01/2022 at 3:15.

## 2022-08-01 ENCOUNTER — Ambulatory Visit (INDEPENDENT_AMBULATORY_CARE_PROVIDER_SITE_OTHER): Payer: Medicaid Other

## 2022-08-01 DIAGNOSIS — Z369 Encounter for antenatal screening, unspecified: Secondary | ICD-10-CM

## 2022-08-01 DIAGNOSIS — Z3480 Encounter for supervision of other normal pregnancy, unspecified trimester: Secondary | ICD-10-CM | POA: Insufficient documentation

## 2022-08-01 NOTE — Progress Notes (Signed)
New OB Intake  I connected with  Tracy Hopkins on 08/01/22 at  3:15 PM EDT by telephone and verified that I am speaking with the correct person using two identifiers. Nurse is located at [facility] and pt is located at home.  I discussed the limitations, risks, security and privacy concerns of performing an evaluation and management service by telephone and the availability of in person appointments. I also discussed with the patient that there may be a patient responsible charge related to this service. The patient expressed understanding and agreed to proceed.  I explained I am completing New OB Intake today. We discussed her EDD of 12/19/2022 that is based on LMP of 03/14/2022. Pt is G1/P0. I reviewed her allergies, medications, Medical/Surgical/OB history, and appropriate screenings. There are no cats in the home.  Based on history, this is a/an pregnancy uncomplicated .   Patient Active Problem List   Diagnosis Date Noted   Supervision of other normal pregnancy, antepartum 08/01/2022   Marijuana use during pregnancy; +UDS MJ 05/09/22 05/13/2022   Abnormal glucose in pregnancy, antepartum 05/13/2022   Elevated blood pressure affecting pregnancy, antepartum 05/09/2022   Supervision of normal first pregnancy, antepartum 05/06/2022   Maternal morbid obesity, antepartum (HCC) 11/01/2018    Concerns addressed today  Delivery Plans:  Plans to deliver at [facility]  Anatomy US Explained she needed an anatomy scan done; to watch for call from schedulers.  Labs Discussed genetic screening with patient. Patient declines genetic testing to be drawn at new OB visit. Discussed possible labs to be drawn at new OB appointment.  COVID Vaccine Patient has had COVID vaccine.   Social Determinants of Health Food Insecurity: expresses food insecurity. Information given on local food banks. Transportation: Patient denies transportation needs. Childcare: Discussed no children  allowed at ultrasound appointments.   First visit review I reviewed new OB appt with pt. I explained she will have ob bloodwork and pap smear/pelvic exam if indicated. Explained pt will be seen by an AOB provider at first visit; encounter routed to appropriate provider.   Tracy Hopkins, [facility] 08/01/2022  3:40 PM

## 2022-08-01 NOTE — Patient Instructions (Signed)
Second Trimester of Pregnancy  The second trimester of pregnancy is from week 13 through week 27. This is months 4 through 6 of pregnancy. The second trimester is often a time when you feel your best. Your body has adjusted to being pregnant, and you begin to feel better physically. During the second trimester: Morning sickness has lessened or stopped completely. You may have more energy. You may have an increase in appetite. The second trimester is also a time when the unborn baby (fetus) is growing rapidly. At the end of the sixth month, the fetus may be up to 12 inches long and weigh about 1 pounds. You will likely begin to feel the baby move (quickening) between 16 and 20 weeks of pregnancy. Body changes during your second trimester Your body continues to go through many changes during your second trimester. The changes vary and generally return to normal after the baby is born. Physical changes Your weight will continue to increase. You will notice your lower abdomen bulging out. You may begin to get stretch marks on your hips, abdomen, and breasts. Your breasts will continue to grow and to become tender. Dark spots or blotches (chloasma or mask of pregnancy) may develop on your face. A dark line from your belly button to the pubic area (linea nigra) may appear. You may have changes in your hair. These can include thickening of your hair, rapid growth, and changes in texture. Some people also have hair loss during or after pregnancy, or hair that feels dry or thin. Health changes You may develop headaches. You may have heartburn. You may develop constipation. You may develop hemorrhoids or swollen, bulging veins (varicose veins). Your gums may bleed and may be sensitive to brushing and flossing. You may urinate more often because the fetus is pressing on your bladder. You may have back pain. This is caused by: Weight gain. Pregnancy hormones that are relaxing the joints in your  pelvis. A shift in weight and the muscles that support your balance. Follow these instructions at home: Medicines Follow your health care provider's instructions regarding medicine use. Specific medicines may be either safe or unsafe to take during pregnancy. Do not take any medicines unless approved by your health care provider. Take a prenatal vitamin that contains at least 600 micrograms (mcg) of folic acid. Eating and drinking Eat a healthy diet that includes fresh fruits and vegetables, whole grains, good sources of protein such as meat, eggs, or tofu, and low-fat dairy products. Avoid raw meat and unpasteurized juice, milk, and cheese. These carry germs that can harm you and your baby. You may need to take these actions to prevent or treat constipation: Drink enough fluid to keep your urine pale yellow. Eat foods that are high in fiber, such as beans, whole grains, and fresh fruits and vegetables. Limit foods that are high in fat and processed sugars, such as fried or sweet foods. Activity Exercise only as directed by your health care provider. Most people can continue their usual exercise routine during pregnancy. Try to exercise for 30 minutes at least 5 days a week. Stop exercising if you develop contractions in your uterus. Stop exercising if you develop pain or cramping in the lower abdomen or lower back. Avoid exercising if it is very hot or humid or if you are at a high altitude. Avoid heavy lifting. If you choose to, you may have sex unless your health care provider tells you not to. Relieving pain and discomfort Wear a supportive   bra to prevent discomfort from breast tenderness. Take warm sitz baths to soothe any pain or discomfort caused by hemorrhoids. Use hemorrhoid cream if your health care provider approves. Rest with your legs raised (elevated) if you have leg cramps or low back pain. If you develop varicose veins: Wear support hose as told by your health care  provider. Elevate your feet for 15 minutes, 3-4 times a day. Limit salt in your diet. Safety Wear your seat belt at all times when driving or riding in a car. Talk with your health care provider if someone is verbally or physically abusive to you. Lifestyle Do not use hot tubs, steam rooms, or saunas. Do not douche. Do not use tampons or scented sanitary pads. Avoid cat litter boxes and soil used by cats. These carry germs that can cause birth defects in the baby and possibly loss of the fetus by miscarriage or stillbirth. Do not use herbal remedies, alcohol, illegal drugs, or medicines that are not approved by your health care provider. Chemicals in these products can harm your baby. Do not use any products that contain nicotine or tobacco, such as cigarettes, e-cigarettes, and chewing tobacco. If you need help quitting, ask your health care provider. General instructions During a routine prenatal visit, your health care provider will do a physical exam and other tests. He or she will also discuss your overall health. Keep all follow-up visits. This is important. Ask your health care provider for a referral to a local prenatal education class. Ask for help if you have counseling or nutritional needs during pregnancy. Your health care provider can offer advice or refer you to specialists for help with various needs. Where to find more information American Pregnancy Association: americanpregnancy.org American College of Obstetricians and Gynecologists: acog.org/en/Womens%20Health/Pregnancy Office on Women's Health: womenshealth.gov/pregnancy Contact a health care provider if you have: A headache that does not go away when you take medicine. Vision changes or you see spots in front of your eyes. Mild pelvic cramps, pelvic pressure, or nagging pain in the abdominal area. Persistent nausea, vomiting, or diarrhea. A bad-smelling vaginal discharge or foul-smelling urine. Pain when you  urinate. Sudden or extreme swelling of your face, hands, ankles, feet, or legs. A fever. Get help right away if you: Have fluid leaking from your vagina. Have spotting or bleeding from your vagina. Have severe abdominal cramping or pain. Have difficulty breathing. Have chest pain. Have fainting spells. Have not felt your baby move for the time period told by your health care provider. Have new or increased pain, swelling, or redness in an arm or leg. Summary The second trimester of pregnancy is from week 13 through week 27 (months 4 through 6). Do not use herbal remedies, alcohol, illegal drugs, or medicines that are not approved by your health care provider. Chemicals in these products can harm your baby. Exercise only as directed by your health care provider. Most people can continue their usual exercise routine during pregnancy. Keep all follow-up visits. This is important. This information is not intended to replace advice given to you by your health care provider. Make sure you discuss any questions you have with your health care provider. Document Revised: 06/12/2019 Document Reviewed: 04/18/2019 Elsevier Patient Education  2024 Elsevier Inc. First Trimester of Pregnancy  The first trimester of pregnancy starts on the first day of your last menstrual period until the end of week 12. This is also called months 1 through 3 of pregnancy. Body changes during your first trimester Your   body goes through many changes during pregnancy. The changes usually return to normal after your baby is born. Physical changes You may gain or lose weight. Your breasts may grow larger and hurt. The area around your nipples may get darker. Dark spots or blotches may develop on your face. You may have changes in your hair. Health changes You may feel like you might vomit (nauseous), and you may vomit. You may have heartburn. You may have headaches. You may have trouble pooping (constipation). Your  gums may bleed. Other changes You may get tired easily. You may pee (urinate) more often. Your menstrual periods will stop. You may not feel hungry. You may want to eat certain kinds of food. You may have changes in your emotions from day to day. You may have more dreams. Follow these instructions at home: Medicines Take over-the-counter and prescription medicines only as told by your doctor. Some medicines are not safe during pregnancy. Take a prenatal vitamin that contains at least 600 micrograms (mcg) of folic acid. Eating and drinking Eat healthy meals that include: Fresh fruits and vegetables. Whole grains. Good sources of protein, such as meat, eggs, or tofu. Low-fat dairy products. Avoid raw meat and unpasteurized juice, milk, and cheese. If you feel like you may vomit, or you vomit: Eat 4 or 5 small meals a day instead of 3 large meals. Try eating a few soda crackers. Drink liquids between meals instead of during meals. You may need to take these actions to prevent or treat trouble pooping: Drink enough fluids to keep your pee (urine) pale yellow. Eat foods that are high in fiber. These include beans, whole grains, and fresh fruits and vegetables. Limit foods that are high in fat and sugar. These include fried or sweet foods. Activity Exercise only as told by your doctor. Most people can do their usual exercise routine during pregnancy. Stop exercising if you have cramps or pain in your lower belly (abdomen) or low back. Do not exercise if it is too hot or too humid, or if you are in a place of great height (high altitude). Avoid heavy lifting. If you choose to, you may have sex unless your doctor tells you not to. Relieving pain and discomfort Wear a good support bra if your breasts are sore. Rest with your legs raised (elevated) if you have leg cramps or low back pain. If you have bulging veins (varicose veins) in your legs: Wear support hose as told by your  doctor. Raise your feet for 15 minutes, 3-4 times a day. Limit salt in your food. Safety Wear your seat belt at all times when you are in a car. Talk with your doctor if someone is hurting you or yelling at you. Talk with your doctor if you are feeling sad or have thoughts of hurting yourself. Lifestyle Do not use hot tubs, steam rooms, or saunas. Do not douche. Do not use tampons or scented sanitary pads. Do not use herbal medicines, illegal drugs, or medicines that are not approved by your doctor. Do not drink alcohol. Do not smoke or use any products that contain nicotine or tobacco. If you need help quitting, ask your doctor. Avoid cat litter boxes and soil that is used by cats. These carry germs that can cause harm to the baby and can cause a loss of your baby by miscarriage or stillbirth. General instructions Keep all follow-up visits. This is important. Ask for help if you need counseling or if you need help with   nutrition. Your doctor can give you advice or tell you where to go for help. Visit your dentist. At home, brush your teeth with a soft toothbrush. Floss gently. Write down your questions. Take them to your prenatal visits. Where to find more information American Pregnancy Association: americanpregnancy.org American College of Obstetricians and Gynecologists: www.acog.org Office on Women's Health: womenshealth.gov/pregnancy Contact a doctor if: You are dizzy. You have a fever. You have mild cramps or pressure in your lower belly. You have a nagging pain in your belly area. You continue to feel like you may vomit, you vomit, or you have watery poop (diarrhea) for 24 hours or longer. You have a bad-smelling fluid coming from your vagina. You have pain when you pee. You are exposed to a disease that spreads from person to person, such as chickenpox, measles, Zika virus, HIV, or hepatitis. Get help right away if: You have spotting or bleeding from your vagina. You have  very bad belly cramping or pain. You have shortness of breath or chest pain. You have any kind of injury, such as from a fall or a car crash. You have new or increased pain, swelling, or redness in an arm or leg. Summary The first trimester of pregnancy starts on the first day of your last menstrual period until the end of week 12 (months 1 through 3). Eat 4 or 5 small meals a day instead of 3 large meals. Do not smoke or use any products that contain nicotine or tobacco. If you need help quitting, ask your doctor. Keep all follow-up visits. This information is not intended to replace advice given to you by your health care provider. Make sure you discuss any questions you have with your health care provider. Document Revised: 06/12/2019 Document Reviewed: 04/18/2019 Elsevier Patient Education  2024 Elsevier Inc. Commonly Asked Questions During Pregnancy  Cats: A parasite can be excreted in cat feces.  To avoid exposure you need to have another person empty the little box.  If you must empty the litter box you will need to wear gloves.  Wash your hands after handling your cat.  This parasite can also be found in raw or undercooked meat so this should also be avoided.  Colds, Sore Throats, Flu: Please check your medication sheet to see what you can take for symptoms.  If your symptoms are unrelieved by these medications please call the office.  Dental Work: Most any dental work your dentist recommends is permitted.  X-rays should only be taken during the first trimester if absolutely necessary.  Your abdomen should be shielded with a lead apron during all x-rays.  Please notify your provider prior to receiving any x-rays.  Novocaine is fine; gas is not recommended.  If your dentist requires a note from us prior to dental work please call the office and we will provide one for you.  Exercise: Exercise is an important part of staying healthy during your pregnancy.  You may continue most exercises  you were accustomed to prior to pregnancy.  Later in your pregnancy you will most likely notice you have difficulty with activities requiring balance like riding a bicycle.  It is important that you listen to your body and avoid activities that put you at a higher risk of falling.  Adequate rest and staying well hydrated are a must!  If you have questions about the safety of specific activities ask your provider.    Exposure to Children with illness: Try to avoid obvious exposure; report any   symptoms to us when noted,  If you have chicken pos, red measles or mumps, you should be immune to these diseases.   Please do not take any vaccines while pregnant unless you have checked with your OB provider.  Fetal Movement: After 28 weeks we recommend you do "kick counts" twice daily.  Lie or sit down in a calm quiet environment and count your baby movements "kicks".  You should feel your baby at least 10 times per hour.  If you have not felt 10 kicks within the first hour get up, walk around and have something sweet to eat or drink then repeat for an additional hour.  If count remains less than 10 per hour notify your provider.  Fumigating: Follow your pest control agent's advice as to how long to stay out of your home.  Ventilate the area well before re-entering.  Hemorrhoids:   Most over-the-counter preparations can be used during pregnancy.  Check your medication to see what is safe to use.  It is important to use a stool softener or fiber in your diet and to drink lots of liquids.  If hemorrhoids seem to be getting worse please call the office.   Hot Tubs:  Hot tubs Jacuzzis and saunas are not recommended while pregnant.  These increase your internal body temperature and should be avoided.  Intercourse:  Sexual intercourse is safe during pregnancy as long as you are comfortable, unless otherwise advised by your provider.  Spotting may occur after intercourse; report any bright red bleeding that is heavier  than spotting.  Labor:  If you know that you are in labor, please go to the hospital.  If you are unsure, please call the office and let us help you decide what to do.  Lifting, straining, etc:  If your job requires heavy lifting or straining please check with your provider for any limitations.  Generally, you should not lift items heavier than that you can lift simply with your hands and arms (no back muscles)  Painting:  Paint fumes do not harm your pregnancy, but may make you ill and should be avoided if possible.  Latex or water based paints have less odor than oils.  Use adequate ventilation while painting.  Permanents & Hair Color:  Chemicals in hair dyes are not recommended as they cause increase hair dryness which can increase hair loss during pregnancy.  " Highlighting" and permanents are allowed.  Dye may be absorbed differently and permanents may not hold as well during pregnancy.  Sunbathing:  Use a sunscreen, as skin burns easily during pregnancy.  Drink plenty of fluids; avoid over heating.  Tanning Beds:  Because their possible side effects are still unknown, tanning beds are not recommended.  Ultrasound Scans:  Routine ultrasounds are performed at approximately 20 weeks.  You will be able to see your baby's general anatomy an if you would like to know the gender this can usually be determined as well.  If it is questionable when you conceived you may also receive an ultrasound early in your pregnancy for dating purposes.  Otherwise ultrasound exams are not routinely performed unless there is a medical necessity.  Although you can request a scan we ask that you pay for it when conducted because insurance does not cover " patient request" scans.  Work: If your pregnancy proceeds without complications you may work until your due date, unless your physician or employer advises otherwise.  Round Ligament Pain/Pelvic Discomfort:  Sharp, shooting pains not associated   with bleeding are  fairly common, usually occurring in the second trimester of pregnancy.  They tend to be worse when standing up or when you remain standing for long periods of time.  These are the result of pressure of certain pelvic ligaments called "round ligaments".  Rest, Tylenol and heat seem to be the most effective relief.  As the womb and fetus grow, they rise out of the pelvis and the discomfort improves.  Please notify the office if your pain seems different than that described.  It may represent a more serious condition.  Common Medications Safe in Pregnancy  Acne:      Constipation:  Benzoyl Peroxide     Colace  Clindamycin      Dulcolax Suppository  Topica Erythromycin     Fibercon  Salicylic Acid      Metamucil         Miralax AVOID:        Senakot   Accutane    Cough:  Retin-A       Cough Drops  Tetracycline      Phenergan w/ Codeine if Rx  Minocycline      Robitussin (Plain & DM)  Antibiotics:     Crabs/Lice:  Ceclor       RID  Cephalosporins    AVOID:  E-Mycins      Kwell  Keflex  Macrobid/Macrodantin   Diarrhea:  Penicillin      Kao-Pectate  Zithromax      Imodium AD         PUSH FLUIDS AVOID:       Cipro     Fever:  Tetracycline      Tylenol (Regular or Extra  Minocycline       Strength)  Levaquin      Extra Strength-Do not          Exceed 8 tabs/24 hrs Caffeine:        <200mg/day (equiv. To 1 cup of coffee or  approx. 3 12 oz sodas)         Gas: Cold/Hayfever:       Gas-X  Benadryl      Mylicon  Claritin       Phazyme  **Claritin-D        Chlor-Trimeton    Headaches:  Dimetapp      ASA-Free Excedrin  Drixoral-Non-Drowsy     Cold Compress  Mucinex (Guaifenasin)     Tylenol (Regular or Extra  Sudafed/Sudafed-12 Hour     Strength)  **Sudafed PE Pseudoephedrine   Tylenol Cold & Sinus     Vicks Vapor Rub  Zyrtec  **AVOID if Problems With Blood Pressure         Heartburn: Avoid lying down for at least 1 hour after meals  Aciphex      Maalox     Rash:  Milk of  Magnesia     Benadryl    Mylanta       1% Hydrocortisone Cream  Pepcid  Pepcid Complete   Sleep Aids:  Prevacid      Ambien   Prilosec       Benadryl  Rolaids       Chamomile Tea  Tums (Limit 4/day)     Unisom         Tylenol PM         Warm milk-add vanilla or  Hemorrhoids:       Sugar for taste  Anusol/Anusol H.C.  (RX: Analapram 2.5%)  Sugar Substitutes:    Hydrocortisone OTC     Ok in moderation  Preparation H      Tucks        Vaseline lotion applied to tissue with wiping    Herpes:     Throat:  Acyclovir      Oragel  Famvir  Valtrex     Vaccines:         Flu Shot Leg Cramps:       *Gardasil  Benadryl      Hepatitis A         Hepatitis B Nasal Spray:       Pneumovax  Saline Nasal Spray     Polio Booster         Tetanus Nausea:       Tuberculosis test or PPD  Vitamin B6 25 mg TID   AVOID:    Dramamine      *Gardasil  Emetrol       Live Poliovirus  Ginger Root 250 mg QID    MMR (measles, mumps &  High Complex Carbs @ Bedtime    rebella)  Sea Bands-Accupressure    Varicella (Chickenpox)  Unisom 1/2 tab TID     *No known complications           If received before Pain:         Known pregnancy;   Darvocet       Resume series after  Lortab        Delivery  Percocet    Yeast:   Tramadol      Femstat  Tylenol 3      Gyne-lotrimin  Ultram       Monistat  Vicodin           MISC:         All Sunscreens           Hair Coloring/highlights          Insect Repellant's          (Including DEET)         Mystic Tans  

## 2022-08-05 ENCOUNTER — Ambulatory Visit
Admission: RE | Admit: 2022-08-05 | Discharge: 2022-08-05 | Disposition: A | Discharge status: Home / Self Care | Payer: Medicaid Other | Source: Ambulatory Visit | Attending: Certified Nurse Midwife | Admitting: Certified Nurse Midwife

## 2022-08-05 DIAGNOSIS — Z3A19 19 weeks gestation of pregnancy: Secondary | ICD-10-CM | POA: Insufficient documentation

## 2022-08-05 DIAGNOSIS — Z3482 Encounter for supervision of other normal pregnancy, second trimester: Secondary | ICD-10-CM | POA: Diagnosis not present

## 2022-08-05 DIAGNOSIS — Z3480 Encounter for supervision of other normal pregnancy, unspecified trimester: Secondary | ICD-10-CM

## 2022-08-05 DIAGNOSIS — Z369 Encounter for antenatal screening, unspecified: Secondary | ICD-10-CM | POA: Insufficient documentation

## 2022-08-16 ENCOUNTER — Other Ambulatory Visit: Payer: Self-pay | Admitting: Certified Nurse Midwife

## 2022-08-16 DIAGNOSIS — O212 Late vomiting of pregnancy: Secondary | ICD-10-CM

## 2022-08-16 NOTE — Telephone Encounter (Signed)
Pt aware to call and get scheduled for u/s to complete anatomy.

## 2022-08-30 ENCOUNTER — Ambulatory Visit: Payer: Medicaid Other

## 2022-08-30 ENCOUNTER — Ambulatory Visit
Admission: RE | Admit: 2022-08-30 | Discharge: 2022-08-30 | Disposition: A | Discharge status: Home / Self Care | Payer: Medicaid Other | Source: Ambulatory Visit | Attending: Certified Nurse Midwife | Admitting: Certified Nurse Midwife

## 2022-08-30 DIAGNOSIS — O212 Late vomiting of pregnancy: Secondary | ICD-10-CM | POA: Insufficient documentation

## 2022-09-09 ENCOUNTER — Telehealth: Payer: Self-pay | Admitting: Certified Nurse Midwife

## 2022-09-09 ENCOUNTER — Encounter: Payer: Medicaid Other | Admitting: Certified Nurse Midwife

## 2022-09-09 NOTE — Telephone Encounter (Signed)
Gwynn Burly out to pt and rescheduled pt for 09/15/2022 with LMD at 9:55.

## 2022-09-15 ENCOUNTER — Other Ambulatory Visit: Payer: Self-pay | Admitting: Licensed Practical Nurse

## 2022-09-15 ENCOUNTER — Ambulatory Visit (INDEPENDENT_AMBULATORY_CARE_PROVIDER_SITE_OTHER): Payer: Medicaid Other | Admitting: Licensed Practical Nurse

## 2022-09-15 ENCOUNTER — Encounter: Payer: Self-pay | Admitting: Licensed Practical Nurse

## 2022-09-15 VITALS — BP 134/88 | HR 92 | Wt 268.5 lb

## 2022-09-15 DIAGNOSIS — O0990 Supervision of high risk pregnancy, unspecified, unspecified trimester: Secondary | ICD-10-CM

## 2022-09-15 DIAGNOSIS — Z3482 Encounter for supervision of other normal pregnancy, second trimester: Secondary | ICD-10-CM

## 2022-09-15 DIAGNOSIS — Z3A26 26 weeks gestation of pregnancy: Secondary | ICD-10-CM

## 2022-09-15 DIAGNOSIS — O99810 Abnormal glucose complicating pregnancy: Secondary | ICD-10-CM

## 2022-09-15 DIAGNOSIS — I10 Essential (primary) hypertension: Secondary | ICD-10-CM

## 2022-09-15 HISTORY — DX: Essential (primary) hypertension: I10

## 2022-09-15 LAB — POCT URINALYSIS DIPSTICK
Bilirubin, UA: NEGATIVE
Blood, UA: NEGATIVE
Glucose, UA: NEGATIVE
Ketones, UA: NEGATIVE
Leukocytes, UA: NEGATIVE
Nitrite, UA: NEGATIVE
Protein, UA: NEGATIVE
Spec Grav, UA: 1.015 (ref 1.010–1.025)
Urobilinogen, UA: 0.2 E.U./dL
pH, UA: 7.5 (ref 5.0–8.0)

## 2022-09-15 MED ORDER — NIFEDIPINE ER OSMOTIC RELEASE 30 MG PO TB24
30.0000 mg | ORAL_TABLET | Freq: Every day | ORAL | 3 refills | Status: DC
Start: 2022-09-15 — End: 2022-10-25

## 2022-09-15 NOTE — Progress Notes (Signed)
New Obstetric Patient H&P    Chief Complaint: "Desires prenatal care"   History of Present Illness: Patient is a 22 y.o. G1P0000 Not Hispanic or Latino female, presents with amenorrhea and positive home pregnancy test. Patient's last menstrual period was 03/14/2022 (approximate). and based on her  LMP, her EDD is Estimated Date of Delivery: 12/19/22 and her EGA is [redacted]w[redacted]d. Cycles are 5. days, regular, and occur approximately every : monthly Her last pap smear was 04/2022 and was no abnormalities.   [facility]. She was told she needed to transfer as she high risk d/t hypertension. Tracy Hopkins has been told that she has high blood pressure in the past, she has never been treated with medication.S he did have PIH labs collected on 07/02/22 (WNL).  She has had an anatomy US with follow up and will need another follow up scan d/t insufficient views of the spine.   This pregnancy was not avoided. She is happy to be pregnant. She would like to breastfeed.    She denies vaginal bleeding. Her past medical [name] Her prior pregnancies are notable for none  Since her LMP, she admits to the use of tobacco products  uses MJ but not tobacco She claims she has gained    3  pounds since the start of her pregnancy.  There are cats in the home in the home  no  She admits close contact with children on a regular basis  no  She has had chicken pox in the past no She has had Tuberculosis exposures, symptoms, or previously tested positive for TB   no Current or past history of domestic violence. no  Genetic Screening/Teratology Counseling: (Includes patient, baby's father, or anyone in either family with:)   1. Patient's age >/= 53 at [facility]  no 2. Thalassemia ([place], [place], Mediterranean, or Asian background): MCV<80  no 3. Neural tube defect (meningomyelocele, spina bifida, anencephaly)  no 4. Congenital heart  defect  no  5. Down syndrome  no 6. Tay-Sachs (Jewish, [place]  no 7. Canavan's Disease  no 8. Sickle cell disease or trait (African)  no  9. Hemophilia or other blood disorders  no  10. Muscular dystrophy  no  11. Cystic fibrosis  no  12. Huntington's Chorea  no  13. Mental retardation/autism  no 14. Other inherited genetic or chromosomal disorder  no 15. Maternal metabolic disorder (DM, PKU, etc)  no 16. Patient or FOB with a child with a birth defect not listed above no  16a. Patient or FOB with a birth defect themselves no 17. Recurrent pregnancy loss, or stillbirth  no  18. Any medications since LMP other than prenatal vitamins (include vitamins, supplements, OTC meds, drugs, alcohol)  yes MJ 19. Any other genetic/environmental exposure to discuss  no  Indications for ASA therapy (per uptodate) One of the following: Previous pregnancy with preeclampsia, especially early onset and with an adverse outcome No Multifetal gestation No Chronic hypertension Yes Type 1 or 2 diabetes mellitus No Chronic kidney disease No Autoimmune disease (antiphospholipid syndrome, systemic lupus erythematosus) No  Two or more of the following: Nulliparity Yes Obesity (body mass index >30 kg/m2) Yes Family history of preeclampsia in mother or sister No Age ?35 years No Sociodemographic characteristics (African American race, low socioeconomic level) Yes Personal risk factors (eg, previous pregnancy with low birth weight or small for gestational age infant, previous adverse pregnancy  outcome [eg, stillbirth], interval >10 years between pregnancies) No  Pt was counseled previously about taking Baby ASA, she admits she never bothered to pick up the medication.    Infection History:   1. Lives with someone with TB or TB exposed  no  2. Patient or partner has history of genital herpes  no 3. Rash or viral illness since LMP  no 4. History of STI (GC, CT, HPV, syphilis, HIV)  no 5. History  of recent travel :  no  Other pertinent information:  yes  Lives with her boyfriend, Tracy Hopkins and his parents. Feels safe Tracy Hopkins has a 2 year old child Tracy Hopkins is currently unemployed Exercise: none Last dental exam years ago  Did get flu and covid vaccines    Review of Systems:10 point review of systems negative unless otherwise noted in HPI  Past Medical History:  Patient Active Problem List   Diagnosis Date Noted Date Diagnosed   Supervision of other normal pregnancy, antepartum 08/01/2022      Clinical Staff Provider  Office Location  [facility] Dating  12/19/2022, Date entered prior to episode creation  Language  English Anatomy US    Flu Vaccine  Offer  Genetic Screen  NIPS:   TDaP vaccine   offer Hgb A1C or  GTT Early : Third trimester :   Covid No boosters   LAB RESULTS   Rhogam  A/Positive/-- (04/22 1533)  Blood Type A/Positive/-- (04/22 1533)   Feeding Plan formula Antibody Negative (04/22 1533)  Contraception depo Rubella 7.31 (04/22 1533)  Circumcision undecided RPR Non Reactive (04/22 1533)   Pediatrician  undecided HBsAg Negative (04/22 1533)   Support Person Tracy Hopkins HIV Non Reactive (04/22 1533)  Prenatal Classes yes Varicella     GBS  (For PCN allergy, check sensitivities)   BTL Consent  Hep C Non Reactive (04/22 1533)   VBAC Consent  Pap No results found for: "DIAGPAP"    Hgb Electro      CF      SMA             Marijuana use during pregnancy; +UDS MJ 05/09/22 05/13/2022     Positive UDS on initial prenatal appt -endorses MJ use in March prior to finding out she was pregnant, counseled not to use in pregnancy    Abnormal glucose in pregnancy, antepartum 05/13/2022     [  ]  Needs 3 hour GTT    Elevated blood pressure affecting pregnancy, antepartum 05/09/2022     Initial BP 129/86, repeat 138/91  Chart review- previous Bps in the 140s, likely chronic HTN, recommended [facility] to [facility]- pt accetps    Supervision of normal first pregnancy,  antepartum 05/06/2022      Nursing Staff Provider  Office Location  [facility] Dating    Language  English Anatomy US    Flu Vaccine  Given 05/09/22 Genetic Screen  NIPS:    AFP:      TDaP vaccine    Hgb A1C or  GTT Early: A1C = 5.2 1 HR GTT= 138  (05/09/22) Third trimester:   COVID vaccine Declined 05/09/22    Rhogam     LAB RESULTS   Feeding Plan Breast Blood Type   A +  Contraception Depo Antibody  Negative 05/09/2022  Circumcision  Rubella  MMR x2       (09/17/02, 04/12/05)  Pediatrician  Undecided - list given 05/09/22 RPR   NR 04/22  Support Person  HBsAg  Neg 04/22  Prenatal Classes  HIV  NR 05/09/2022  @28wk - Doula referral?  Varicella  Varivax x2     (09/17/02, 04/12/05)    HCV  NR 04/22  BTL Consent  GBS    ASA   Accepts 4/22    VBAC Consent  Pap      Hgb Electro  Negative 05/09/2022  BP Cuff ordered  CF   Delivery Group  [facility] - contact card given 05/09/22 SMA   Centering Group  OBCM involved         Maternal morbid obesity, antepartum (HCC) 11/01/2018     Recommendations [ x] Aspirin 81 mg daily after 12 weeks, accepts 4/22 continue until end of pregnancy [ ]  Nutrition consult [ ]  if BMI gets > 45, will need anesthesia consult at 34-36 weeks [x ] Weight gain 11-20 lbs for singleton and 25-35 lbs for twin pregnancy (IOM guidelines) Higher class of obesity patients recommended to gain closer to lower limit  Weight loss is associated with adverse outcomes [x ] Baseline and surveillance labs (pulled in from [facility], refresh links as needed)  Lab Results  Component Value Date   PLT 235 04/06/2021   CREATININE 0.80 04/06/2021   AST 14 (L) 04/06/2021   ALT 15 04/06/2021    Antenatal Testing:  [ x] Growth scans every 4-6 weeks at 28 weeks (fundal height likely inadequate in morbidly obese patients) [ ]  @ [facility] start NST 2x weekly at 36, NST/ AFI weekly after 36 weeks  Postpartum Care: [ ]  Consider prophylactic wound vac/PICO for C/S [ ]  Lovenox for DVT/PE prophylaxis (6 hours  after vaginal delivery, 12 hours after C/S).   Lovenox 40 mg [place] q24h (BMI 30.0-39.9 kg/m2)  Lovenox 0.5 mg/kg [place] q12h ((BMI ?40 kg/m2 ); Max 150 mg [place] q12h.  Consider prolonged therapy x 6 weeks PP in very concerning patients (I.e morbid obesity with other co-morbidities that increase risk of DVT/PE) [ ]  Counsel about diet, exercise and weight loss. Referrals PRN.  ICD10 Codes: O99.210   Obesity in pregnancy (BMI 30.0-39.9 kg/m2)  O99.210, E66.01 Maternal Morbid Obesity (BMI ?40 kg/m2 ).**Have to use both codes, this is a HCC code and risk adjusts/more reimbursement**       Past Surgical History:  Past Surgical History:  Procedure Laterality Date   wisdom tooth exttraction     x1, local anesthesia    Gynecologic History: Patient's last menstrual period was 03/14/2022 (approximate).  Obstetric History: G1P0000  Family History:  Family History  Problem Relation Age of Onset   Diabetes Mother    Hypertension Mother    Diabetes Father    Healthy Sister    Asthma Brother    Healthy Maternal Grandmother    Healthy Maternal Grandfather    Diabetes Paternal Grandmother    Healthy Paternal Grandfather    Healthy Half-Brother    Healthy Half-Brother    Healthy Half-Brother    Healthy Half-Sister    Healthy Half-Sister    Healthy Half-Sister     Social History:  Social History   Socioeconomic History   Marital status: Significant Other    Spouse name: Nelly Rout   Number of children: 0   Years of education: 12   Highest education level: High school graduate  Occupational History   Occupation: Unemployed  Tobacco Use   Smoking status: Former    Types: Cigars    Quit date: 04/16/2022    Years since quitting: 0.4    Passive exposure: Past  Smokeless tobacco: Never   Tobacco comments:    No smoking since found out pregnant; would smoke 2 Black and Mild cigars every "couple of days".    Given [place] QuitLine card.  Vaping Use   Vaping status: Never Used  Substance  and Sexual Activity   Alcohol use: Not Currently    Comment: Last ETOH 04/09/22.   Drug use: Not Currently    Comment: Last MJ use 04/16/22; none since found out pregnant.   Sexual activity: Yes    Partners: Male    Birth control/protection: None    Comment: Reports last Depo in 2019 with intermittent condom use since. No condom use in 2024.  Other Topics Concern   Not on file  Social History Narrative   Lives alone.   FOB is a Customer service manager.   Social Determinants of Health   Financial Resource Strain: Low Risk  (08/01/2022)   Overall Financial Resource Strain (CARDIA)    Difficulty of Paying Living Expenses: Not very hard  Food Insecurity: No Food Insecurity (08/01/2022)   Hunger Vital Sign    Worried About Running Out of Food in the Last Year: Never true    Ran Out of Food in the Last Year: Never true  Recent Concern: Food Insecurity - Food Insecurity Present (05/09/2022)   Hunger Vital Sign    Worried About Running Out of Food in the Last Year: Sometimes true    Ran Out of Food in the Last Year: Never true  Transportation Needs: No Transportation Needs (08/01/2022)   PRAPARE - Administrator, Civil Service (Medical): No    Lack of Transportation (Non-Medical): No  Physical Activity: Insufficiently Active (08/01/2022)   Exercise Vital Sign    Days of Exercise per Week: 1 day    Minutes of Exercise per Session: 50 min  Stress: No Stress Concern Present (08/01/2022)   [employer] of Occupational Health - Occupational Stress Questionnaire    Feeling of Stress : Not at all  Social Connections: Moderately Integrated (08/01/2022)   Social Connection and Isolation Panel [NHANES]    Frequency of Communication with Friends and Family: Twice a week    Frequency of Social Gatherings with Friends and Family: Once a week    Attends Religious Services: More than 4 times per year    Active Member of [employer] or Organizations: No    Attends Banker Meetings:  Never    Marital Status: Living with partner  Intimate Partner Violence: Not At Risk (08/01/2022)   Humiliation, Afraid, Rape, and Kick questionnaire    Fear of Current or Ex-Partner: No    Emotionally Abused: No    Physically Abused: No    Sexually Abused: No    Allergies:  No Known Allergies  Medications: Prior to Admission medications   Medication Sig Start Date End Date Taking? Authorizing Provider  NIFEdipine (PROCARDIA-XL/NIFEDICAL-XL) 30 MG 24 hr tablet Take 1 tablet (30 mg total) by mouth daily. Can increase to twice a day as needed for symptomatic contractions 09/15/22  Yes Daylynn Stumpp, Courtney Heys, CNM  Prenatal Vit-Fe Fumarate-FA (MULTIVITAMIN-PRENATAL) 27-0.8 MG TABS tablet Take 1 tablet by mouth daily at 12 noon.   Yes [provider]    Physical Exam Vitals: Blood pressure (!) 151/79, pulse 98, weight 268 lb 8 oz (121.8 kg), last menstrual period 03/14/2022.  General: NAD HEENT: normocephalic, anicteric Thyroid: no enlargement, no palpable nodules Pulmonary: No increased work of breathing, CTAB Cardiovascular: RRR, distal pulses 2+  Abdomen: NABS, soft, non-tender, non-distended.  Umbilicus without lesions.  No hepatomegaly, splenomegaly or masses palpable. No evidence of hernia  fetal hearttones 140 Genitourinary:exam deferred   External:               Vagina:   Cervix:   Uterus:   Adnexa:   Rectal: deferred Extremities: no edema, erythema, or tenderness Neurologic: Grossly intact Psychiatric: mood appropriate, affect full   Assessment: 22 y.o. G1P0000 at [redacted]w[redacted]d presenting to initiate prenatal care  Plan: 1) Avoid alcoholic beverages. 2) Patient encouraged not to smoke.  3) Discontinue the use of all non-medicinal drugs and chemicals.  4) Take prenatal vitamins daily.  5) Nutrition, food safety (fish, cheese advisories, and high nitrite foods) and exercise discussed. 6) Hospital and practice style discussed with cross coverage system.  7) Genetic  Screening, such as with 1st Trimester Screening, cell free fetal DNA, AFP testing, and Ultrasound, as well as with amniocentesis and CVS as appropriate, is discussed with patient. At the conclusion of today's visit patient declined genetic testing 8) Patient is asked about travel to areas at risk for the [place] virus, and counseled to avoid travel and exposure to mosquitoes or sexual partners who may have themselves been exposed to the virus. Testing is discussed, and will be ordered as appropriate.   Hep C, Varicella, and UDS collected Will need 3 hour glucose and baseline PIH labs at a future apt, pt to schedule 3-hour test Fu anatomy US ordered Reviewed POC for CHTN and BMI >40 -will start Procardia XL 30mg  daily, return in 1 week for BP check -monthly growth scans -NST starting at 36 weeks -Birth by 40 weeks  -pt beyond the gestation to benefit from ASA therapy   Carie Caddy, CNM  [facility] 09/15/2022, 10:36 AM

## 2022-09-16 LAB — VARICELLA ZOSTER ANTIBODY, IGG: Varicella zoster IgG: <135 {index} — ABNORMAL LOW (ref >165)

## 2022-09-16 LAB — HEPATITIS C ANTIBODY: Hep C Virus Ab: NONREACTIVE

## 2022-09-18 LAB — URINE DRUG PANEL 7
Amphetamines, Urine: NEGATIVE ng/mL
Barbiturate Quant, Ur: NEGATIVE ng/mL
Benzodiazepine Quant, Ur: NEGATIVE ng/mL
Cannabinoid Quant, Ur: POSITIVE — AB
Cocaine (Metab.): NEGATIVE ng/mL
Opiate Quant, Ur: NEGATIVE ng/mL
PCP Quant, Ur: NEGATIVE ng/mL

## 2022-09-18 LAB — NICOTINE SCREEN, URINE: Cotinine Ql Scrn, Ur: NEGATIVE ng/mL

## 2022-09-22 ENCOUNTER — Ambulatory Visit (INDEPENDENT_AMBULATORY_CARE_PROVIDER_SITE_OTHER): Payer: Medicaid Other | Admitting: Certified Nurse Midwife

## 2022-09-22 VITALS — BP 128/80 | HR 92 | Wt 267.0 lb

## 2022-09-22 DIAGNOSIS — O0993 Supervision of high risk pregnancy, unspecified, third trimester: Secondary | ICD-10-CM

## 2022-09-22 DIAGNOSIS — O0992 Supervision of high risk pregnancy, unspecified, second trimester: Secondary | ICD-10-CM

## 2022-09-22 DIAGNOSIS — Z3A27 27 weeks gestation of pregnancy: Secondary | ICD-10-CM

## 2022-09-22 NOTE — Progress Notes (Signed)
Pt for BP check , started on procardia Xl 30 mg daily , last week. BP 128/80 today. Pt denies any issue with medication. Pt instructed to continue with medication use. Discussed glucose screen in one week. Reviewed eating prior to testing. She verbalizes understanding. Discussed Tdap , she declines taking it. Follow up 1 wk for rob.   [name], CNM

## 2022-09-22 NOTE — Patient Instructions (Signed)
Oral Glucose Tolerance Test During Pregnancy Why am I having this test? The oral glucose tolerance test (OGTT) is done to check how your body processes blood sugar (glucose). This is one of several tests used to diagnose diabetes that develops during pregnancy (gestational diabetes mellitus). Gestational diabetes is a short-term form of diabetes that some women develop while they are pregnant. It usually occurs during the second trimester of pregnancy and goes away after delivery. Testing, or screening, for gestational diabetes usually occurs at weeks 24-28 of pregnancy. You may have the OGTT test after having a 1-hour glucose screening test if the results from that test indicate that you may have gestational diabetes. This test may also be needed if: You have a history of gestational diabetes. There is a history of giving birth to very large babies or of losing pregnancies (having stillbirths). You have signs and symptoms of diabetes, such as: Changes in your eyesight. Tingling or numbness in your hands or feet. Changes in hunger, thirst, and urination, and these are not explained by your pregnancy. What is being tested? This test measures the amount of glucose in your blood at different times during a period of 3 hours. This shows how well your body can process glucose. What kind of sample is taken?  Blood samples are required for this test. They are usually collected by inserting a needle into a blood vessel. How do I prepare for this test? For 3 days before your test, eat normally. Have plenty of carbohydrate-rich foods. Follow instructions from your health care provider about: Eating or drinking restrictions on the day of the test. You may be asked not to eat or drink anything other than water (to fast) starting 8-10 hours before the test. Changing or stopping your regular medicines. Some medicines may interfere with this test. Tell a health care provider about: All medicines you are  taking, including vitamins, herbs, eye drops, creams, and over-the-counter medicines. Any blood disorders you have. Any surgeries you have had. Any medical conditions you have. What happens during the test? First, your blood glucose will be measured. This is referred to as your fasting blood glucose because you fasted before the test. Then, you will drink a glucose solution that contains a certain amount of glucose. Your blood glucose will be measured again 1, 2, and 3 hours after you drink the solution. This test takes about 3 hours to complete. You will need to stay at the testing location during this time. During the testing period: Do not eat or drink anything other than the glucose solution. Do not exercise. Do not use any products that contain nicotine or tobacco, such as cigarettes, e-cigarettes, and chewing tobacco. These can affect your test results. If you need help quitting, ask your health care provider. The testing procedure may vary among health care providers and hospitals. How are the results reported? Your results will be reported as milligrams of glucose per deciliter of blood (mg/dL) or millimoles per liter (mmol/L). There is more than one source for screening and diagnosis reference values used to diagnose gestational diabetes. Your health care provider will compare your results to normal values that were established after testing a large group of people (reference values). Reference values may vary among labs and hospitals. For this test (Carpenter-Coustan), reference values are: Fasting: 95 mg/dL (5.3 mmol/L). 1 hour: 180 mg/dL (10.0 mmol/L). 2 hour: 155 mg/dL (8.6 mmol/L). 3 hour: 140 mg/dL (7.8 mmol/L). What do the results mean? Results below the reference values are   considered normal. If two or more of your blood glucose levels are at or above the reference values, you may be diagnosed with gestational diabetes. If only one level is high, your health care provider may  suggest repeat testing or other tests to confirm a diagnosis. Talk with your health care provider about what your results mean. Questions to ask your health care provider Ask your health care provider, or the department that is doing the test: When will my results be ready? How will I get my results? What are my treatment options? What other tests do I need? What are my next steps? Summary The oral glucose tolerance test (OGTT) is one of several tests used to diagnose diabetes that develops during pregnancy (gestational diabetes mellitus). Gestational diabetes is a short-term form of diabetes that some women develop while they are pregnant. You may have the OGTT test after having a 1-hour glucose screening test if the results from that test show that you may have gestational diabetes. You may also have this test if you have any symptoms or risk factors for this type of diabetes. Talk with your health care provider about what your results mean. This information is not intended to replace advice given to you by your health care provider. Make sure you discuss any questions you have with your health care provider. Document Revised: 08/10/2021 Document Reviewed: 06/13/2019 Elsevier Patient Education  2024 Elsevier Inc.  

## 2022-09-26 ENCOUNTER — Ambulatory Visit
Admission: RE | Admit: 2022-09-26 | Discharge: 2022-09-26 | Disposition: A | Discharge status: Home / Self Care | Payer: Medicaid Other | Source: Ambulatory Visit | Attending: Licensed Practical Nurse | Admitting: Licensed Practical Nurse

## 2022-09-26 DIAGNOSIS — O0990 Supervision of high risk pregnancy, unspecified, unspecified trimester: Secondary | ICD-10-CM | POA: Diagnosis present

## 2022-09-29 ENCOUNTER — Other Ambulatory Visit: Payer: Medicaid Other

## 2022-09-29 DIAGNOSIS — I10 Essential (primary) hypertension: Secondary | ICD-10-CM

## 2022-09-29 DIAGNOSIS — O0993 Supervision of high risk pregnancy, unspecified, third trimester: Secondary | ICD-10-CM

## 2022-09-30 ENCOUNTER — Other Ambulatory Visit: Payer: Self-pay | Admitting: Certified Nurse Midwife

## 2022-09-30 ENCOUNTER — Other Ambulatory Visit: Payer: Self-pay

## 2022-09-30 DIAGNOSIS — O99810 Abnormal glucose complicating pregnancy: Secondary | ICD-10-CM

## 2022-09-30 LAB — COMPREHENSIVE METABOLIC PANEL
ALT: 22 IU/L (ref 0–32)
AST: 17 IU/L (ref 0–40)
Albumin: 3.7 g/dL — ABNORMAL LOW (ref 4.0–5.0)
Alkaline Phosphatase: 92 IU/L (ref 44–121)
BUN/Creatinine Ratio: 11 (ref 9–23)
BUN: 6 mg/dL (ref 6–20)
Bilirubin Total: <0.2 mg/dL (ref 0.0–1.2)
CO2: 16 mmol/L — ABNORMAL LOW (ref 20–29)
Calcium: 8.8 mg/dL (ref 8.7–10.2)
Chloride: 104 mmol/L (ref 96–106)
Creatinine, Ser: 0.53 mg/dL — ABNORMAL LOW (ref 0.57–1.00)
Globulin, Total: 2.2 g/dL (ref 1.5–4.5)
Glucose: 173 mg/dL — ABNORMAL HIGH (ref 70–99)
Potassium: 3.5 mmol/L (ref 3.5–5.2)
Sodium: 135 mmol/L (ref 134–144)
Total Protein: 5.9 g/dL — ABNORMAL LOW (ref 6.0–8.5)
eGFR: 134 mL/min/{1.73_m2} (ref >59)

## 2022-09-30 LAB — 28 WEEK RH+PANEL
Basophils Absolute: 0 10*3/uL (ref 0.0–0.2)
Basos: 0 %
EOS (ABSOLUTE): 0.1 10*3/uL (ref 0.0–0.4)
Eos: 1 %
Gestational Diabetes Screen: 174 mg/dL — ABNORMAL HIGH (ref 70–139)
HIV Screen 4th Generation wRfx: NONREACTIVE
Hematocrit: 31.4 % — ABNORMAL LOW (ref 34.0–46.6)
Hemoglobin: 10.3 g/dL — ABNORMAL LOW (ref 11.1–15.9)
Immature Grans (Abs): 0 10*3/uL (ref 0.0–0.1)
Immature Granulocytes: 0 %
Lymphocytes Absolute: 1.4 10*3/uL (ref 0.7–3.1)
Lymphs: 20 %
MCH: 29.1 pg (ref 26.6–33.0)
MCHC: 32.8 g/dL (ref 31.5–35.7)
MCV: 89 fL (ref 79–97)
Monocytes Absolute: 0.3 10*3/uL (ref 0.1–0.9)
Monocytes: 4 %
Neutrophils Absolute: 5.3 10*3/uL (ref 1.4–7.0)
Neutrophils: 75 %
Platelets: 221 10*3/uL (ref 150–450)
RBC: 3.54 x10E6/uL — ABNORMAL LOW (ref 3.77–5.28)
RDW: 12.7 % (ref 11.7–15.4)
RPR Ser Ql: NONREACTIVE
WBC: 7.1 10*3/uL (ref 3.4–10.8)

## 2022-09-30 LAB — PROTEIN / CREATININE RATIO, URINE
Creatinine, Urine: 159.9 mg/dL
Protein, Ur: 36.3 mg/dL
Protein/Creat Ratio: 227 mg/g{creat} — ABNORMAL HIGH (ref 0–200)

## 2022-09-30 MED ORDER — FUSION PLUS PO CAPS: 1.0000 | ORAL_CAPSULE | Freq: Every day | ORAL | 6 refills | Status: DC

## 2022-10-05 ENCOUNTER — Telehealth: Payer: Self-pay

## 2022-10-05 ENCOUNTER — Encounter: Payer: Medicaid Other | Admitting: Obstetrics

## 2022-10-05 ENCOUNTER — Telehealth: Payer: Self-pay | Admitting: Certified Nurse Midwife

## 2022-10-05 ENCOUNTER — Other Ambulatory Visit: Payer: Medicaid Other

## 2022-10-05 DIAGNOSIS — Z3A29 29 weeks gestation of pregnancy: Secondary | ICD-10-CM

## 2022-10-05 DIAGNOSIS — Z3480 Encounter for supervision of other normal pregnancy, unspecified trimester: Secondary | ICD-10-CM

## 2022-10-05 NOTE — Telephone Encounter (Signed)
Pt called in on 10/05/2022 to reschedule 3 hr glucose test and ROB appt.  Was rescheduled for 10/10/2022-3 hr glucose at 8:00 and ROB appt with JEG at 11:15.

## 2022-10-05 NOTE — Telephone Encounter (Signed)
Reached out to pt to reschedule ROB appt that was scheduled on 10/05/2022 with MMF at 11:15.  Left message for pt to call back to reschedule.

## 2022-10-05 NOTE — Telephone Encounter (Signed)
Reached out to pt to reschedule 3 hr glucose test that was scheduled on 10/05/2022 at 8:00.  Left message for pt to call back to reschedule.

## 2022-10-05 NOTE — Telephone Encounter (Signed)
Pt states a sharp pain on upper right side since 1 am, pt states when she moves it hurts or touches it it hurts, pain a level 7, feeling baby move a little kick every 3-4 hours. Per margaret pt should try 2 tylenol now and 2 more in 4 hours. Try a heating pad. Call us tomorrow if the pain is still there. Pt states she had a family night last night and there was a lot of moving around, that's when it started. She will monitor the pain and call if needed.

## 2022-10-10 ENCOUNTER — Encounter: Payer: Self-pay | Admitting: Advanced Practice Midwife

## 2022-10-10 ENCOUNTER — Other Ambulatory Visit: Payer: Medicaid Other

## 2022-10-10 ENCOUNTER — Ambulatory Visit: Payer: Medicaid Other | Admitting: Family Medicine

## 2022-10-10 ENCOUNTER — Ambulatory Visit (INDEPENDENT_AMBULATORY_CARE_PROVIDER_SITE_OTHER): Payer: Medicaid Other | Admitting: Advanced Practice Midwife

## 2022-10-10 VITALS — BP 138/84 | HR 92 | Wt 269.8 lb

## 2022-10-10 DIAGNOSIS — Z3A30 30 weeks gestation of pregnancy: Secondary | ICD-10-CM

## 2022-10-10 DIAGNOSIS — O0993 Supervision of high risk pregnancy, unspecified, third trimester: Secondary | ICD-10-CM

## 2022-10-10 DIAGNOSIS — I10 Essential (primary) hypertension: Secondary | ICD-10-CM

## 2022-10-10 DIAGNOSIS — O99810 Abnormal glucose complicating pregnancy: Secondary | ICD-10-CM

## 2022-10-10 DIAGNOSIS — O99213 Obesity complicating pregnancy, third trimester: Secondary | ICD-10-CM

## 2022-10-10 LAB — POCT URINALYSIS DIPSTICK
Bilirubin, UA: NEGATIVE
Blood, UA: NEGATIVE
Glucose, UA: NEGATIVE
Ketones, UA: NEGATIVE
Leukocytes, UA: NEGATIVE
Nitrite, UA: NEGATIVE
Protein, UA: NEGATIVE
Spec Grav, UA: 1.02 (ref 1.010–1.025)
Urobilinogen, UA: 0.2 E.U./dL
pH, UA: 7.5 (ref 5.0–8.0)

## 2022-10-10 NOTE — Progress Notes (Signed)
Routine Prenatal Care Visit  Subjective  Tracy Hopkins is a 22 y.o. G1P0000 at [redacted]w[redacted]d being seen today for ongoing prenatal care.  She is currently monitored for the following issues for this high-risk pregnancy and has Obesity affecting pregnancy; Supervision of high-risk pregnancy; Elevated blood pressure affecting pregnancy, antepartum; Marijuana use during pregnancy; +UDS MJ 05/09/22; Abnormal glucose in pregnancy, antepartum; and Chronic hypertension on their problem list.  ----------------------------------------------------------------------------------- Patient reports she is doing well. 3 hr gtt today.   Contractions: Not present. Vag. Bleeding: None.  Movement: Present. Leaking Fluid denies.  ----------------------------------------------------------------------------------- The following portions of the patient's history were reviewed and updated as appropriate: allergies, current medications, past family history, past medical history, past social history, past surgical history and problem list. Problem list updated.  Objective  Blood pressure 138/84, pulse 92, weight 269 lb 12.8 oz (122.4 kg), last menstrual period 03/14/2022. Pregravid weight 265 lb (120.2 kg) Total Weight Gain 4 lb 12.8 oz (2.177 kg) Urinalysis: Urine Protein    Urine Glucose    Fetal Status:     Movement: Present     General:  Alert, oriented and cooperative. Patient is in no acute distress.  Skin: Skin is warm and dry. No rash noted.   Cardiovascular: Normal heart rate noted  Respiratory: Normal respiratory effort, no problems with respiration noted  Abdomen: Soft, gravid, appropriate for gestational age. Pain/Pressure: Absent     Pelvic:  Cervical exam deferred        Extremities: Normal range of motion.  Edema: None  Mental Status: Normal mood and affect. Normal behavior. Normal judgment and thought content.   Assessment   22 y.o. G1P0000 at [redacted]w[redacted]d by  12/19/2022, Date entered prior to episode creation  presenting for routine prenatal visit  Plan   first Problems (from 05/09/22 to present)     Problem Noted Resolved   Chronic hypertension 09/15/2022 by Ellwood Sayers, CNM No   Marijuana use during pregnancy; +UDS MJ 05/09/22 05/13/2022 by Lenice Llamas, FNP No   Overview Addendum 05/13/2022  8:39 AM by Lenice Llamas, FNP    Positive UDS on initial prenatal appt -endorses MJ use in March prior to finding out she was pregnant, counseled not to use in pregnancy      Abnormal glucose in pregnancy, antepartum 05/13/2022 by Jossie Ng, RN No   Overview Signed 05/13/2022  2:58 PM by Jossie Ng, RN    [  ]  Needs 3 hour GTT      Elevated blood pressure affecting pregnancy, antepartum 05/09/2022 by Lenice Llamas, FNP No   Overview Addendum 05/13/2022  8:38 AM by Lenice Llamas, FNP    Initial BP 129/86, repeat 138/91  Chart review- previous Bps in the 140s, likely chronic HTN, recommended tx [facility] care to [facility]- pt accetps      Supervision of high-risk pregnancy 05/06/2022 by Lenice Llamas, FNP No   Overview Addendum 05/13/2022  2:24 PM by Pearletha Furl, RN     Nursing Staff Provider  Office Location  [facility] Dating    Language  English Anatomy US    Flu Vaccine  Given 05/09/22 Genetic Screen  NIPS:    AFP:      TDaP vaccine    Hgb A1C or  GTT Early: A1C = 5.2 1 HR GTT= 138  (05/09/22) Third trimester:   COVID vaccine Declined 05/09/22    Rhogam     LAB RESULTS   Feeding Plan Breast Blood Type   A +  Contraception  Depo Antibody  Negative 05/09/2022  Circumcision  Rubella  MMR x2       (09/17/02, 04/12/05)  Pediatrician  Undecided - list given 05/09/22 RPR   NR 04/22  Support Person  HBsAg   Neg 04/22  Prenatal Classes  HIV  NR 05/09/2022  @28wk - Doula referral?  Varicella  Varivax x2     (09/17/02, 04/12/05)    HCV  NR 04/22  BTL Consent  GBS    ASA   Accepts 4/22    VBAC Consent  Pap      Hgb Electro  Negative 05/09/2022  BP Cuff ordered  CF   Delivery Group   [facility] - contact card given 05/09/22 [facility]   Centering Group  OBCM involved           Obesity affecting pregnancy 11/01/2018 by Alberteen Spindle, CNM No   Overview Addendum 05/09/2022  3:14 PM by Lenice Llamas, FNP    Recommendations [ x] Aspirin 81 mg daily after 12 weeks, accepts 4/22 continue until end of pregnancy [ ]  Nutrition consult [ ]  if BMI gets > 45, will need anesthesia consult at 34-36 weeks [x ] Weight gain 11-20 lbs for singleton and 25-35 lbs for twin pregnancy (IOM guidelines) Higher class of obesity patients recommended to gain closer to lower limit  Weight loss is associated with adverse outcomes [x ] Baseline and surveillance labs (pulled in from [facility], refresh links as needed)  Lab Results  Component Value Date   PLT 235 04/06/2021   CREATININE 0.80 04/06/2021   AST 14 (L) 04/06/2021   ALT 15 04/06/2021    Antenatal Testing:  [ x] Growth scans every 4-6 weeks at 28 weeks (fundal height likely inadequate in morbidly obese patients) [ ]  @ [facility] start NST 2x weekly at 36, NST/ AFI weekly after 36 weeks  Postpartum Care: [ ]  Consider prophylactic wound vac/PICO for C/S [ ]  Lovenox for DVT/PE prophylaxis (6 hours after vaginal delivery, 12 hours after C/S).   Lovenox 40 mg [place] q24h (BMI 30.0-39.9 kg/m2)  Lovenox 0.5 mg/kg [place] q12h ((BMI >=40 kg/m2 ); Max 150 mg [place] q12h.  Consider prolonged therapy x 6 weeks PP in very concerning patients (I.e morbid obesity with other co-morbidities that increase risk of DVT/PE) [ ]  Counsel about diet, exercise and weight loss. Referrals PRN.  ICD10 Codes: O99.210   Obesity in pregnancy (BMI 30.0-39.9 kg/m2)  O99.210, E66.01 Maternal Morbid Obesity (BMI >=40 kg/m2 ).**Have to use both codes, this is a HCC code and risk adjusts/more reimbursement**             Preterm labor symptoms and general obstetric precautions including but not limited to vaginal bleeding, contractions, leaking of fluid and fetal movement were reviewed  in detail with the patient. Please refer to After Visit Summary for other counseling recommendations.   Return in about 2 weeks (around 10/24/2022) for rob.  Tresea Mall, CNM 10/10/2022 11:44 AM

## 2022-10-10 NOTE — Addendum Note (Signed)
Addended by: Kathlene Cote on: 10/10/2022 02:45 PM   Modules accepted: Orders

## 2022-10-10 NOTE — Addendum Note (Signed)
Addended by: Burtis Junes on: 10/10/2022 01:52 PM   Modules accepted: Orders

## 2022-10-11 ENCOUNTER — Encounter: Payer: Self-pay | Admitting: Certified Nurse Midwife

## 2022-10-11 LAB — GESTATIONAL GLUCOSE TOLERANCE
Glucose, Fasting: 81 mg/dL (ref 70–94)
Glucose, GTT - 1 Hour: 169 mg/dL (ref 70–179)
Glucose, GTT - 2 Hour: 152 mg/dL (ref 70–154)
Glucose, GTT - 3 Hour: 57 mg/dL — ABNORMAL LOW (ref 70–139)

## 2022-10-21 ENCOUNTER — Other Ambulatory Visit: Payer: Self-pay | Admitting: Advanced Practice Midwife

## 2022-10-21 DIAGNOSIS — O0993 Supervision of high risk pregnancy, unspecified, third trimester: Secondary | ICD-10-CM

## 2022-10-21 DIAGNOSIS — Z3A30 30 weeks gestation of pregnancy: Secondary | ICD-10-CM

## 2022-10-21 DIAGNOSIS — I10 Essential (primary) hypertension: Secondary | ICD-10-CM

## 2022-10-21 DIAGNOSIS — Z3689 Encounter for other specified antenatal screening: Secondary | ICD-10-CM

## 2022-10-21 DIAGNOSIS — O99213 Obesity complicating pregnancy, third trimester: Secondary | ICD-10-CM

## 2022-10-24 ENCOUNTER — Ambulatory Visit
Admission: RE | Admit: 2022-10-24 | Discharge: 2022-10-24 | Disposition: A | Discharge status: Home / Self Care | Payer: Medicaid Other | Source: Ambulatory Visit | Attending: Advanced Practice Midwife | Admitting: Advanced Practice Midwife

## 2022-10-24 DIAGNOSIS — O99213 Obesity complicating pregnancy, third trimester: Secondary | ICD-10-CM | POA: Insufficient documentation

## 2022-10-24 DIAGNOSIS — Z3689 Encounter for other specified antenatal screening: Secondary | ICD-10-CM | POA: Diagnosis present

## 2022-10-25 ENCOUNTER — Ambulatory Visit (INDEPENDENT_AMBULATORY_CARE_PROVIDER_SITE_OTHER): Payer: Medicaid Other | Admitting: Certified Nurse Midwife

## 2022-10-25 ENCOUNTER — Encounter: Payer: Self-pay | Admitting: Certified Nurse Midwife

## 2022-10-25 VITALS — BP 130/84 | HR 91 | Wt 267.1 lb

## 2022-10-25 DIAGNOSIS — O0990 Supervision of high risk pregnancy, unspecified, unspecified trimester: Secondary | ICD-10-CM

## 2022-10-25 DIAGNOSIS — O0993 Supervision of high risk pregnancy, unspecified, third trimester: Secondary | ICD-10-CM

## 2022-10-25 DIAGNOSIS — I10 Essential (primary) hypertension: Secondary | ICD-10-CM

## 2022-10-25 DIAGNOSIS — Z3A32 32 weeks gestation of pregnancy: Secondary | ICD-10-CM

## 2022-10-25 MED ORDER — NIFEDIPINE ER OSMOTIC RELEASE 30 MG PO TB24: 30.0000 mg | ORAL_TABLET | Freq: Every day | ORAL | 1 refills | Status: DC

## 2022-10-25 NOTE — Patient Instructions (Signed)

## 2022-10-25 NOTE — Progress Notes (Signed)
ROB [redacted]w[redacted]d: She is doing well. She reports some mild cramping and good fetal movement. She has no new concerns today.

## 2022-10-25 NOTE — Progress Notes (Signed)
    Return Prenatal Note   Subjective   22 y.o. G1P0000 at [redacted]w[redacted]d presents for this follow-up prenatal visit.  Patient feeling well, active baby, no complaint of. Ran out of nifedipine Sunday, refill needed. BPP/growth ultrasound completed yesterday. Patient reports: Movement: Present Contractions: Regular  Objective   Flow sheet Vitals: Pulse Rate: 91 BP: 130/84 Fundal Height: 32 cm Fetal Heart Rate (bpm): 140 Total weight gain: 2 lb 1.6 oz (0.953 kg)  General Appearance  No acute distress, well appearing, and well nourished Pulmonary   Normal work of breathing Neurologic   Alert and oriented to person, place, and time Psychiatric   Mood and affect within normal limits  Assessment/Plan   Plan  22 y.o. G1P0000 at [redacted]w[redacted]d presents for follow-up OB visit. Reviewed prenatal record including previous visit note. 1. Supervision of high risk pregnancy in third trimester - POC Urinalysis Dipstick OB  2. Chronic hypertension - POC Urinalysis Dipstick OB  3. [redacted] weeks gestation of pregnancy - POC Urinalysis Dipstick OB  4. Supervision of high risk pregnancy, antepartum  Reviewed NSTs beginning next visit at 34w then weekly. Continue q4w growth, EFW 46%ile/1925g & BPP 8/8.  Recommended vaccines reviewed, Flu, TDAP & Abrysvo, will plan to receive at next visit.  Orders Placed This Encounter  Procedures   POC Urinalysis Dipstick OB   Return in 2 weeks (on 11/08/2022) for ROB & NST.   Future Appointments  Date Time Provider Department Center  11/08/2022 10:15 AM [facility] ROOM [facility] None  11/08/2022 10:55 AM Linzie Collin, MD [facility] None    For next visit:  continue with routine prenatal care     Dominica Severin, CNM  10/08/249:25 AM

## 2022-11-01 ENCOUNTER — Telehealth: Payer: Self-pay

## 2022-11-01 ENCOUNTER — Ambulatory Visit: Payer: Medicaid Other

## 2022-11-01 NOTE — Telephone Encounter (Signed)
Pt calling; at 4am she went to [facility] and saw a little bit of spotting on the tissue; about an hour later she went back to the [facility] and there was no spotting.  Does she need to go to the ER?  [phone]  Adv pt since it's gone there is no need to be seen; to monitor it and if becomes like a period to let us know.  Pt voices understanding.

## 2022-11-02 ENCOUNTER — Encounter: Payer: Medicaid Other | Admitting: Certified Nurse Midwife

## 2022-11-02 ENCOUNTER — Telehealth: Payer: Self-pay | Admitting: Certified Nurse Midwife

## 2022-11-02 NOTE — Telephone Encounter (Signed)
Reached out to pt to reschedule ROB appt that was scheduled on 10/16/20224 at 1:15.  Left message for pt to call back .

## 2022-11-03 ENCOUNTER — Encounter: Payer: Self-pay | Admitting: Certified Nurse Midwife

## 2022-11-03 NOTE — Telephone Encounter (Signed)
Reached out to pt (2x) to reschedule ROB appt that was scheduled on 11/02/2022 at 1:15.  Left message for pt to call back.  Will send a MyChart letter.

## 2022-11-07 DIAGNOSIS — Z3A34 34 weeks gestation of pregnancy: Secondary | ICD-10-CM | POA: Insufficient documentation

## 2022-11-07 NOTE — Progress Notes (Deleted)
    NURSE VISIT NOTE  Subjective:    Patient ID: Tracy Hopkins, female    DOB: 16-Nov-2000, 22 y.o.   MRN: [id number]  HPI  Patient is a 22 y.o. G2P0000 female who presents for fetal monitoring per order from Hartley Barefoot, CNM.   Objective:    LMP 03/14/2022 (Approximate) Comment: states menses was light and only 3 days Estimated Date of Delivery: 12/19/22  [redacted]w[redacted]d  Fetus A Non-Stress Test Interpretation for 11/07/22  Indication: Chronic Hypertenstion and Obesity            Assessment:   1. Chronic hypertension   2. Obesity affecting pregnancy in third trimester, unspecified obesity type   3. [redacted] weeks gestation of pregnancy      Plan:   Results reviewed and discussed with patient by  Brennan Bailey, MD.     Rocco Serene, LPN

## 2022-11-08 ENCOUNTER — Encounter: Payer: Medicaid Other | Admitting: Obstetrics and Gynecology

## 2022-11-08 ENCOUNTER — Other Ambulatory Visit: Payer: Medicaid Other

## 2022-11-08 DIAGNOSIS — Z3A34 34 weeks gestation of pregnancy: Secondary | ICD-10-CM

## 2022-11-08 DIAGNOSIS — I10 Essential (primary) hypertension: Secondary | ICD-10-CM

## 2022-11-08 DIAGNOSIS — O0993 Supervision of high risk pregnancy, unspecified, third trimester: Secondary | ICD-10-CM

## 2022-11-08 DIAGNOSIS — O99213 Obesity complicating pregnancy, third trimester: Secondary | ICD-10-CM

## 2022-11-08 NOTE — Telephone Encounter (Signed)
This encounter was created in error - please disregard.

## 2022-11-08 NOTE — Telephone Encounter (Signed)
Patient is 15 minutes late for NST appointment today. Left voicemail to return call.

## 2022-11-08 NOTE — Telephone Encounter (Signed)
Reached out to pt to reschedule appts that were scheduled on 11/08/2022-NST was scheduled at 10:15 and ROB was scheduled at 10:55.  Left message for pt to call back to reschedule.

## 2022-11-08 NOTE — Telephone Encounter (Signed)
Pt calling; is spotting again like last time; denies IC in the last 24-48 hours and heavy lifting.  Hasn't spotted since the morning and is cramping a little bit.  Adv since no active spotting now okay to wait for her appt tomorrow. Pt agrees.

## 2022-11-09 ENCOUNTER — Other Ambulatory Visit (HOSPITAL_COMMUNITY)
Admission: RE | Admit: 2022-11-09 | Discharge: 2022-11-09 | Disposition: A | Discharge status: Home / Self Care | Payer: Medicaid Other | Source: Ambulatory Visit | Attending: Obstetrics and Gynecology | Admitting: Obstetrics and Gynecology

## 2022-11-09 ENCOUNTER — Ambulatory Visit (INDEPENDENT_AMBULATORY_CARE_PROVIDER_SITE_OTHER): Payer: Medicaid Other

## 2022-11-09 ENCOUNTER — Ambulatory Visit (INDEPENDENT_AMBULATORY_CARE_PROVIDER_SITE_OTHER): Payer: Medicaid Other | Admitting: Certified Nurse Midwife

## 2022-11-09 VITALS — BP 123/79 | HR 105 | Ht 68.0 in | Wt 270.8 lb

## 2022-11-09 VITALS — BP 123/79 | HR 105 | Wt 270.8 lb

## 2022-11-09 DIAGNOSIS — Z3A34 34 weeks gestation of pregnancy: Secondary | ICD-10-CM | POA: Insufficient documentation

## 2022-11-09 DIAGNOSIS — O10913 Unspecified pre-existing hypertension complicating pregnancy, third trimester: Secondary | ICD-10-CM

## 2022-11-09 DIAGNOSIS — O10013 Pre-existing essential hypertension complicating pregnancy, third trimester: Secondary | ICD-10-CM

## 2022-11-09 DIAGNOSIS — N898 Other specified noninflammatory disorders of vagina: Secondary | ICD-10-CM

## 2022-11-09 DIAGNOSIS — R82998 Other abnormal findings in urine: Secondary | ICD-10-CM

## 2022-11-09 DIAGNOSIS — E669 Obesity, unspecified: Secondary | ICD-10-CM

## 2022-11-09 DIAGNOSIS — O99213 Obesity complicating pregnancy, third trimester: Secondary | ICD-10-CM

## 2022-11-09 HISTORY — DX: Unspecified pre-existing hypertension complicating pregnancy, third trimester: O10.913

## 2022-11-09 LAB — POCT URINALYSIS DIPSTICK OB
Bilirubin, UA: NEGATIVE
Blood, UA: NEGATIVE
Glucose, UA: NEGATIVE
Nitrite, UA: NEGATIVE
Spec Grav, UA: 1.015 (ref 1.010–1.025)
Urobilinogen, UA: 0.2 U/dL
pH, UA: 6 (ref 5.0–8.0)

## 2022-11-09 NOTE — Patient Instructions (Signed)

## 2022-11-09 NOTE — Addendum Note (Signed)
Addended by: Fonda Kinder on: 11/09/2022 03:18 PM   Modules accepted: Orders

## 2022-11-09 NOTE — Progress Notes (Signed)
ROB and NST for CHTN, obesity . Pt feeling good movement. NST reactive see nurse note. Pt notes vaginal itching/discharge. Self swab completed today. Will follow up with results. Urine culture sent for moderate leukocytes.   Follow up 1 wk for NST 2 weeks growth /BPP and ROB.   [name], CNM

## 2022-11-09 NOTE — Progress Notes (Signed)
    NURSE VISIT NOTE  Subjective:    Patient ID: Tracy Hopkins, female    DOB: 06-26-2000, 22 y.o.   MRN: [id number]  HPI  Patient is a 22 y.o. G62P0000 female who presents for fetal monitoring per order from Hartley Barefoot, CNM.   Objective:    BP 123/79   Pulse (!) 105   Ht 5\' 8"  (1.727 m)   Wt 270 lb 12.8 oz (122.8 kg)   LMP 03/14/2022 (Approximate) Comment: states menses was light and only 3 days  BMI 41.17 kg/m  Estimated Date of Delivery: 12/19/22  [redacted]w[redacted]d  Fetus A Non-Stress Test Interpretation for 11/09/22  Indication: Chronic Hypertenstion and Obesity  Fetal Heart Rate A Mode: External Baseline Rate (A): 140 bpm Variability: Moderate Accelerations: 15 x 15 Decelerations: None Multiple birth?: No  Uterine Activity Mode: Toco Contraction Frequency (min): None  Interpretation (Fetal Testing) Nonstress Test Interpretation: Reactive Overall Impression: Reassuring for gestational age   Assessment:   1. Maternal chronic hypertension, third trimester   2. Obesity affecting pregnancy in third trimester, unspecified obesity type   3. [redacted] weeks gestation of pregnancy      Plan:   Results reviewed and discussed with patient by  [name], CNM.     Rocco Serene, LPN

## 2022-11-09 NOTE — Patient Instructions (Signed)
Round Ligament Pain  The round ligaments are a pair of cord-like tissues that help support the uterus. They can become a source of pain during pregnancy as the ligaments soften and stretch as the baby grows. The pain usually begins in the second trimester (13-28 weeks) of pregnancy, and should only last for a few seconds when it occurs. However, the pain can come and go until the baby is delivered. The pain does not cause harm to the baby. Round ligament pain is usually a short, sharp, and pinching pain, but it can also be a dull, lingering, and aching pain. The pain is felt in the lower side of the abdomen or in the groin. It usually starts deep in the groin and moves up to the outside of the hip area. The pain may happen when you: Suddenly change position, such as quickly going from a sitting to standing position. Do physical activity. Cough or sneeze. Follow these instructions at home: Managing pain  When the pain starts, relax. Then, try any of these methods to help with the pain: Sit down. Flex your knees up to your abdomen. Lie on your side with one pillow under your abdomen and another pillow between your legs. Sit in a warm bath for 15-20 minutes or until the pain goes away. General instructions Watch your condition for any changes. Move slowly when you sit down or stand up. Stop or reduce your physical activities if they cause pain. Avoid long walks if they cause pain. Take over-the-counter and prescription medicines only as told by your health care provider. Keep all follow-up visits. This is important. Contact a health care provider if: Your pain does not go away with treatment. You feel pain in your back that you did not have before. Your medicine is not helping. You have a fever or chills. You have nausea or vomiting. You have diarrhea. You have pain when you urinate. Get help right away if: You have pain that is a rhythmic, cramping pain similar to labor pains. Labor  pains are usually 2 minutes apart, last for about 1 minute, and involve a bearing down feeling or pressure in your pelvis. You have vaginal bleeding. These symptoms may represent a serious problem that is an emergency. Do not wait to see if the symptoms will go away. Get medical help right away. Call your local emergency services (911 in the [place] Do not drive yourself to the hospital. Summary Round ligament pain is felt in the lower abdomen or groin. This pain usually begins in the second trimester (13-28 weeks) and should only last for a few seconds when it occurs. You may notice the pain when you suddenly change position, when you cough or sneeze, or during physical activity. Relaxing, flexing your knees to your abdomen, lying on one side, or taking a warm bath may help to get rid of the pain. Contact your health care provider if the pain does not go away. This information is not intended to replace advice given to you by your health care provider. Make sure you discuss any questions you have with your health care provider. Document Revised: 03/18/2020 Document Reviewed: 03/18/2020 Elsevier Patient Education  2024 Elsevier Inc.  

## 2022-11-10 ENCOUNTER — Encounter: Payer: Self-pay | Admitting: Certified Nurse Midwife

## 2022-11-11 LAB — URINE CULTURE

## 2022-11-14 LAB — CERVICOVAGINAL ANCILLARY ONLY
Bacterial Vaginitis (gardnerella): POSITIVE — AB
Candida Glabrata: NEGATIVE
Candida Vaginitis: POSITIVE — AB
Comment: NEGATIVE
Comment: NEGATIVE
Comment: NEGATIVE

## 2022-11-15 ENCOUNTER — Encounter: Payer: Self-pay | Admitting: Certified Nurse Midwife

## 2022-11-15 ENCOUNTER — Other Ambulatory Visit: Payer: Self-pay | Admitting: Certified Nurse Midwife

## 2022-11-15 MED ORDER — FLUCONAZOLE 150 MG PO TABS: 150.0000 mg | ORAL_TABLET | Freq: Once | ORAL | 1 refills | Status: AC

## 2022-11-15 MED ORDER — METRONIDAZOLE 500 MG PO TABS: 500.0000 mg | ORAL_TABLET | Freq: Two times a day (BID) | ORAL | 0 refills | Status: AC

## 2022-11-16 ENCOUNTER — Telehealth: Payer: Self-pay

## 2022-11-16 NOTE — Telephone Encounter (Signed)
Pt called triage stating she needs proof for her [employer]NAP benefits that she is still pregnant. Advised to print her recent AVS. She said she doesn't have access to MyChart. I helped her reset her password and I waited for her to log in on her smart phone. She confirmed logging in. She will let us know if she needs anything else.

## 2022-11-17 ENCOUNTER — Other Ambulatory Visit: Payer: Self-pay

## 2022-11-17 ENCOUNTER — Ambulatory Visit: Payer: Medicaid Other

## 2022-11-17 ENCOUNTER — Ambulatory Visit (INDEPENDENT_AMBULATORY_CARE_PROVIDER_SITE_OTHER): Payer: Medicaid Other

## 2022-11-17 VITALS — BP 133/82 | HR 106 | Ht 68.0 in | Wt 271.0 lb

## 2022-11-17 VITALS — BP 133/82 | HR 106 | Wt 271.0 lb

## 2022-11-17 DIAGNOSIS — O10013 Pre-existing essential hypertension complicating pregnancy, third trimester: Secondary | ICD-10-CM | POA: Diagnosis not present

## 2022-11-17 DIAGNOSIS — O99213 Obesity complicating pregnancy, third trimester: Secondary | ICD-10-CM | POA: Diagnosis not present

## 2022-11-17 DIAGNOSIS — O10913 Unspecified pre-existing hypertension complicating pregnancy, third trimester: Secondary | ICD-10-CM

## 2022-11-17 DIAGNOSIS — E669 Obesity, unspecified: Secondary | ICD-10-CM

## 2022-11-17 DIAGNOSIS — Z3A35 35 weeks gestation of pregnancy: Secondary | ICD-10-CM

## 2022-11-17 DIAGNOSIS — O0993 Supervision of high risk pregnancy, unspecified, third trimester: Secondary | ICD-10-CM

## 2022-11-17 DIAGNOSIS — O09899 Supervision of other high risk pregnancies, unspecified trimester: Secondary | ICD-10-CM | POA: Insufficient documentation

## 2022-11-17 LAB — POCT URINALYSIS DIPSTICK OB
Glucose, UA: NEGATIVE
Nitrite, UA: NEGATIVE
Spec Grav, UA: 1.025 (ref 1.010–1.025)
Urobilinogen, UA: 1 U/dL
pH, UA: 5 (ref 5.0–8.0)

## 2022-11-17 NOTE — Assessment & Plan Note (Signed)
Prepared for GBS and GC/CT screening at next visit. Reviewed kick counts and preterm labor warning signs. Instructed to call office or come to hospital with persistent headache, vision changes, regular contractions, leaking of fluid, decreased fetal movement or vaginal bleeding.

## 2022-11-17 NOTE — Assessment & Plan Note (Addendum)
-   Initial BP 159/102, after reactive NST, BP 133/82.  - UA moderate protein, PIH labs collected. Denies HA, vision changes, RUQ pain. Plan follow up early next week.  - Reviewed preeclampsia precautions, when to come to triage.  - Has growth ultrasound and BPP on 11/4.

## 2022-11-17 NOTE — Progress Notes (Signed)
d   Return Prenatal Note   Assessment/Plan   Plan  22 y.o. G1P0000 at [redacted]w[redacted]d presents for follow-up OB visit. Reviewed prenatal record including previous visit note.  Maternal chronic hypertension, third trimester - Initial BP 159/102, after reactive NST, BP 133/82.  - UA moderate protein, PIH labs collected. Denies HA, vision changes, RUQ pain. Plan follow up early next week.  - Reviewed preeclampsia precautions, when to come to triage.  - Has growth ultrasound and BPP on 11/4.   Supervision of high-risk pregnancy - Prepared for GBS and GC/CT screening at next visit.  - Reviewed kick counts and preterm labor warning signs. Instructed to call office or come to hospital with persistent headache, vision changes, regular contractions, leaking of fluid, decreased fetal movement or vaginal bleeding.   Orders Placed This Encounter  Procedures   CBC   Comprehensive metabolic panel   Protein / creatinine ratio, urine   POC Urinalysis Dipstick OB   Return in about 1 week (around 11/24/2022) for ROB.   Future Appointments  Date Time Provider Department Center  11/21/2022 10:30 AM [facility]    For next visit:  ROB with GBS screening and NST     Subjective   22 y.o. G1P0000 at [redacted]w[redacted]d presents for this follow-up prenatal visit.  Patient has no concerns. Has a headache a few days ago that went away without any medication.  Patient reports: Movement: Present Contractions: Irritability  Objective   Flow sheet Vitals: Pulse Rate: (!) 106 BP: 133/82 Fundal Height: 35 cm Fetal Heart Rate (bpm): RNST Total weight gain: 6 lb (2.722 kg)  General Appearance  No acute distress, well appearing, and well nourished Pulmonary   Normal work of breathing Neurologic   Alert and oriented to person, place, and time Psychiatric   Mood and affect within normal limits  Tracy Hopkins, CNM  10/31/244:24 PM

## 2022-11-17 NOTE — Progress Notes (Signed)
    NURSE VISIT NOTE  Subjective:    Patient ID: Tracy Hopkins, female    DOB: Mar 24, 2000, 22 y.o.   MRN: [id number]  HPI  Patient is a 22 y.o. G65P0000 female who presents for fetal monitoring per order from [name], CNM.   Objective:    BP 133/82   Pulse (!) 106   Ht 5\' 8"  (1.727 m)   Wt 271 lb (122.9 kg)   LMP 03/14/2022 (Approximate) Comment: states menses was light and only 3 days  BMI 41.21 kg/m  Estimated Date of Delivery: 12/19/22  103w3d  Fetus A Non-Stress Test Interpretation for 11/17/22  Indication: Chronic Hypertenstion and Obesity  Fetal Heart Rate A Mode: External Baseline Rate (A): 130 bpm Variability: Moderate Accelerations: 15 x 15 Decelerations: None Multiple birth?: No  Uterine Activity Mode: Toco Contraction Frequency (min): None  Interpretation (Fetal Testing) Nonstress Test Interpretation: Reactive Overall Impression: Reassuring for gestational age   Assessment:   1. Maternal chronic hypertension, third trimester   2. Obesity affecting pregnancy in third trimester, unspecified obesity type   3. [redacted] weeks gestation of pregnancy      Plan:   Results reviewed and discussed with patient by  [name], CNM.     Rocco Serene, LPN

## 2022-11-17 NOTE — Patient Instructions (Signed)

## 2022-11-18 LAB — COMPREHENSIVE METABOLIC PANEL
ALT: 8 [IU]/L (ref 0–32)
AST: 14 [IU]/L (ref 0–40)
Albumin: 3.8 g/dL — ABNORMAL LOW (ref 4.0–5.0)
Alkaline Phosphatase: 144 [IU]/L — ABNORMAL HIGH (ref 44–121)
BUN/Creatinine Ratio: 13 (ref 9–23)
BUN: 7 mg/dL (ref 6–20)
Bilirubin Total: <0.2 mg/dL (ref 0.0–1.2)
CO2: 16 mmol/L — ABNORMAL LOW (ref 20–29)
Calcium: 9.5 mg/dL (ref 8.7–10.2)
Chloride: 106 mmol/L (ref 96–106)
Creatinine, Ser: 0.56 mg/dL — ABNORMAL LOW (ref 0.57–1.00)
Globulin, Total: 2.6 g/dL (ref 1.5–4.5)
Glucose: 126 mg/dL — ABNORMAL HIGH (ref 70–99)
Potassium: 4 mmol/L (ref 3.5–5.2)
Sodium: 136 mmol/L (ref 134–144)
Total Protein: 6.4 g/dL (ref 6.0–8.5)
eGFR: 132 mL/min/{1.73_m2} (ref >59)

## 2022-11-18 LAB — PROTEIN / CREATININE RATIO, URINE
Creatinine, Urine: 290.8 mg/dL
Protein, Ur: 48 mg/dL
Protein/Creat Ratio: 165 mg/g{creat} (ref 0–200)

## 2022-11-18 NOTE — Telephone Encounter (Signed)
Discussed with Guadlupe Spanish who advised in office BP check with provider.

## 2022-11-18 NOTE — Telephone Encounter (Signed)
Patient aware. She will contact boyfriend to see if he can bring her. She will call back to let us know.

## 2022-11-18 NOTE — Telephone Encounter (Signed)
Patient aware to increase procardia to 60mg  every day. Appointment scheduled for Monday at 1:15 for BP Check w/Annie (Nurse Schedule Full)

## 2022-11-21 ENCOUNTER — Ambulatory Visit (INDEPENDENT_AMBULATORY_CARE_PROVIDER_SITE_OTHER): Payer: Medicaid Other | Admitting: Certified Nurse Midwife

## 2022-11-21 ENCOUNTER — Ambulatory Visit
Admission: RE | Admit: 2022-11-21 | Discharge: 2022-11-21 | Disposition: A | Discharge status: Home / Self Care | Payer: Medicaid Other | Source: Ambulatory Visit | Attending: Certified Nurse Midwife | Admitting: Certified Nurse Midwife

## 2022-11-21 ENCOUNTER — Other Ambulatory Visit (HOSPITAL_COMMUNITY)
Admission: RE | Admit: 2022-11-21 | Discharge: 2022-11-21 | Disposition: A | Discharge status: Home / Self Care | Payer: Medicaid Other | Source: Ambulatory Visit | Attending: Certified Nurse Midwife | Admitting: Certified Nurse Midwife

## 2022-11-21 VITALS — BP 130/80 | HR 94 | Wt 270.9 lb

## 2022-11-21 DIAGNOSIS — O10913 Unspecified pre-existing hypertension complicating pregnancy, third trimester: Secondary | ICD-10-CM

## 2022-11-21 DIAGNOSIS — Z3A36 36 weeks gestation of pregnancy: Secondary | ICD-10-CM | POA: Diagnosis not present

## 2022-11-21 DIAGNOSIS — Z113 Encounter for screening for infections with a predominantly sexual mode of transmission: Secondary | ICD-10-CM | POA: Diagnosis present

## 2022-11-21 DIAGNOSIS — O10013 Pre-existing essential hypertension complicating pregnancy, third trimester: Secondary | ICD-10-CM | POA: Diagnosis not present

## 2022-11-21 DIAGNOSIS — Z3685 Encounter for antenatal screening for Streptococcus B: Secondary | ICD-10-CM

## 2022-11-21 DIAGNOSIS — O0993 Supervision of high risk pregnancy, unspecified, third trimester: Secondary | ICD-10-CM

## 2022-11-21 DIAGNOSIS — R82998 Other abnormal findings in urine: Secondary | ICD-10-CM

## 2022-11-21 DIAGNOSIS — O99891 Other specified diseases and conditions complicating pregnancy: Secondary | ICD-10-CM | POA: Diagnosis not present

## 2022-11-21 LAB — POCT URINALYSIS DIPSTICK OB
Blood, UA: NEGATIVE
Glucose, UA: NEGATIVE
Nitrite, UA: NEGATIVE
Spec Grav, UA: 1.025 (ref 1.010–1.025)
Urobilinogen, UA: 0.2 U/dL
pH, UA: 5 (ref 5.0–8.0)

## 2022-11-21 NOTE — Progress Notes (Signed)
ROB/BP check . Procardia XL increased to 60 mg daily. Pt denies concerns today. Feeling good movement. Had BPP /us results not available yet. NST not needed today due to BPP . Will do next week. GBS and cultures collected. BP  130/80, pt encouraged to continue with current dose . Follow up 1 wks for NST and ROB.  [name], CNM

## 2022-11-21 NOTE — Addendum Note (Signed)
Addended by: Fonda Kinder on: 11/21/2022 02:12 PM   Modules accepted: Orders

## 2022-11-21 NOTE — Patient Instructions (Signed)

## 2022-11-23 ENCOUNTER — Encounter: Payer: Medicaid Other | Admitting: Obstetrics

## 2022-11-23 ENCOUNTER — Other Ambulatory Visit: Payer: Medicaid Other

## 2022-11-23 LAB — CERVICOVAGINAL ANCILLARY ONLY
Chlamydia: NEGATIVE
Comment: NEGATIVE
Comment: NORMAL
Neisseria Gonorrhea: NEGATIVE

## 2022-11-23 LAB — URINE CULTURE

## 2022-11-23 LAB — STREP GP B NAA: Strep Gp B NAA: POSITIVE — AB

## 2022-11-26 ENCOUNTER — Other Ambulatory Visit: Payer: Self-pay

## 2022-11-26 ENCOUNTER — Encounter: Payer: Self-pay | Admitting: Obstetrics and Gynecology

## 2022-11-26 ENCOUNTER — Inpatient Hospital Stay
Admission: EM | Admit: 2022-11-26 | Discharge: 2022-11-29 | DRG: 806 | Disposition: A | Discharge status: Home / Self Care | Payer: Medicaid Other | Attending: Licensed Practical Nurse | Admitting: Obstetrics and Gynecology

## 2022-11-26 DIAGNOSIS — O99824 Streptococcus B carrier state complicating childbirth: Secondary | ICD-10-CM | POA: Diagnosis present

## 2022-11-26 DIAGNOSIS — Z8249 Family history of ischemic heart disease and other diseases of the circulatory system: Secondary | ICD-10-CM

## 2022-11-26 DIAGNOSIS — O134 Gestational [pregnancy-induced] hypertension without significant proteinuria, complicating childbirth: Principal | ICD-10-CM | POA: Diagnosis present

## 2022-11-26 DIAGNOSIS — O99214 Obesity complicating childbirth: Secondary | ICD-10-CM | POA: Diagnosis present

## 2022-11-26 DIAGNOSIS — Z825 Family history of asthma and other chronic lower respiratory diseases: Secondary | ICD-10-CM

## 2022-11-26 DIAGNOSIS — Z87891 Personal history of nicotine dependence: Secondary | ICD-10-CM

## 2022-11-26 DIAGNOSIS — Z3A36 36 weeks gestation of pregnancy: Secondary | ICD-10-CM

## 2022-11-26 DIAGNOSIS — F1290 Cannabis use, unspecified, uncomplicated: Secondary | ICD-10-CM

## 2022-11-26 DIAGNOSIS — Z2839 Other underimmunization status: Secondary | ICD-10-CM

## 2022-11-26 DIAGNOSIS — Z833 Family history of diabetes mellitus: Secondary | ICD-10-CM

## 2022-11-26 DIAGNOSIS — O09899 Supervision of other high risk pregnancies, unspecified trimester: Secondary | ICD-10-CM

## 2022-11-26 DIAGNOSIS — O9081 Anemia of the puerperium: Secondary | ICD-10-CM | POA: Diagnosis not present

## 2022-11-26 DIAGNOSIS — O169 Unspecified maternal hypertension, unspecified trimester: Secondary | ICD-10-CM

## 2022-11-26 DIAGNOSIS — O1092 Unspecified pre-existing hypertension complicating childbirth: Secondary | ICD-10-CM | POA: Diagnosis present

## 2022-11-26 DIAGNOSIS — D62 Acute posthemorrhagic anemia: Secondary | ICD-10-CM | POA: Diagnosis not present

## 2022-11-26 DIAGNOSIS — O10919 Unspecified pre-existing hypertension complicating pregnancy, unspecified trimester: Secondary | ICD-10-CM | POA: Diagnosis present

## 2022-11-26 DIAGNOSIS — O99810 Abnormal glucose complicating pregnancy: Secondary | ICD-10-CM

## 2022-11-26 DIAGNOSIS — O0993 Supervision of high risk pregnancy, unspecified, third trimester: Secondary | ICD-10-CM

## 2022-11-26 DIAGNOSIS — O99213 Obesity complicating pregnancy, third trimester: Principal | ICD-10-CM

## 2022-11-26 LAB — CBC WITH DIFFERENTIAL/PLATELET
Abs Immature Granulocytes: 0.04 10*3/uL (ref 0.00–0.07)
Basophils Absolute: 0 10*3/uL (ref 0.0–0.1)
Basophils Relative: 0 %
Eosinophils Absolute: 0 10*3/uL (ref 0.0–0.5)
Eosinophils Relative: 0 %
HCT: 31.2 % — ABNORMAL LOW (ref 36.0–46.0)
Hemoglobin: 10.8 g/dL — ABNORMAL LOW (ref 12.0–15.0)
Immature Granulocytes: 0 %
Lymphocytes Relative: 11 %
Lymphs Abs: 1.2 10*3/uL (ref 0.7–4.0)
MCH: 29.7 pg (ref 26.0–34.0)
MCHC: 34.6 g/dL (ref 30.0–36.0)
MCV: 85.7 fL (ref 80.0–100.0)
Monocytes Absolute: 0.5 10*3/uL (ref 0.1–1.0)
Monocytes Relative: 4 %
Neutro Abs: 9.2 10*3/uL — ABNORMAL HIGH (ref 1.7–7.7)
Neutrophils Relative %: 85 %
Platelets: 253 10*3/uL (ref 150–400)
RBC: 3.64 MIL/uL — ABNORMAL LOW (ref 3.87–5.11)
RDW: 13.2 % (ref 11.5–15.5)
WBC: 11 10*3/uL — ABNORMAL HIGH (ref 4.0–10.5)
nRBC: 0 % (ref 0.0–0.2)

## 2022-11-26 LAB — COMPREHENSIVE METABOLIC PANEL WITH GFR
ALT: 12 U/L (ref 0–44)
AST: 15 U/L (ref 15–41)
Albumin: 3.2 g/dL — ABNORMAL LOW (ref 3.5–5.0)
Alkaline Phosphatase: 131 U/L — ABNORMAL HIGH (ref 38–126)
Anion gap: 11 (ref 5–15)
BUN: 11 mg/dL (ref 6–20)
CO2: 17 mmol/L — ABNORMAL LOW (ref 22–32)
Calcium: 9.5 mg/dL (ref 8.9–10.3)
Chloride: 106 mmol/L (ref 98–111)
Creatinine, Ser: 0.51 mg/dL (ref 0.44–1.00)
GFR, Estimated: >60 mL/min
Glucose, Bld: 95 mg/dL (ref 70–99)
Potassium: 3.6 mmol/L (ref 3.5–5.1)
Sodium: 134 mmol/L — ABNORMAL LOW (ref 135–145)
Total Bilirubin: 0.2 mg/dL
Total Protein: 6.6 g/dL (ref 6.5–8.1)

## 2022-11-26 MED ORDER — NIFEDIPINE ER OSMOTIC RELEASE 30 MG PO TB24: 60.0000 mg | ORAL_TABLET | Freq: Every day | ORAL | Status: DC

## 2022-11-26 MED ORDER — NIFEDIPINE ER OSMOTIC RELEASE 30 MG PO TB24
ORAL_TABLET | ORAL | Status: AC
  Administered 2022-11-27: 60 mg via ORAL
  Filled 2022-11-26: qty 2

## 2022-11-26 MED ORDER — ACETAMINOPHEN 500 MG PO TABS: 1000.0000 mg | ORAL_TABLET | Freq: Four times a day (QID) | ORAL | Status: DC | PRN

## 2022-11-26 MED ORDER — NIFEDIPINE ER OSMOTIC RELEASE 30 MG PO TB24
60.0000 mg | ORAL_TABLET | Freq: Every day | ORAL | Status: DC
  Administered 2022-11-26 – 2022-11-29 (×3): 60 mg via ORAL
  Filled 2022-11-26 (×3): qty 2

## 2022-11-26 NOTE — OB Triage Note (Signed)
Patient is G1P0 is [redacted]w[redacted]d days is due Dec 19 2022, patient seen at [facility]. Last seen on the 4th . Patient states pelvic pain started around 6pm and rates it about 6/10. Patient reports lower back pain and an episode of diarrhea earlier this morning as well. Denies discharge or LOF.Marland Kitchen patient stated PIH labs were performed at last visit. Patient has CHTN. Is on procardia. Patient reports small headache accompanied by right sided abd pain. Monitors applied FHR 145. Vitals obtained. Provider to be notified.

## 2022-11-27 ENCOUNTER — Inpatient Hospital Stay: Payer: Medicaid Other | Admitting: Anesthesiology

## 2022-11-27 ENCOUNTER — Encounter: Payer: Self-pay | Admitting: Obstetrics and Gynecology

## 2022-11-27 DIAGNOSIS — O134 Gestational [pregnancy-induced] hypertension without significant proteinuria, complicating childbirth: Principal | ICD-10-CM

## 2022-11-27 DIAGNOSIS — Z87891 Personal history of nicotine dependence: Secondary | ICD-10-CM | POA: Diagnosis not present

## 2022-11-27 DIAGNOSIS — Z8249 Family history of ischemic heart disease and other diseases of the circulatory system: Secondary | ICD-10-CM | POA: Diagnosis not present

## 2022-11-27 DIAGNOSIS — O9081 Anemia of the puerperium: Secondary | ICD-10-CM | POA: Diagnosis not present

## 2022-11-27 DIAGNOSIS — O9903 Anemia complicating the puerperium: Secondary | ICD-10-CM | POA: Diagnosis not present

## 2022-11-27 DIAGNOSIS — R109 Unspecified abdominal pain: Secondary | ICD-10-CM | POA: Diagnosis present

## 2022-11-27 DIAGNOSIS — Z825 Family history of asthma and other chronic lower respiratory diseases: Secondary | ICD-10-CM | POA: Diagnosis not present

## 2022-11-27 DIAGNOSIS — O99824 Streptococcus B carrier state complicating childbirth: Secondary | ICD-10-CM | POA: Diagnosis present

## 2022-11-27 DIAGNOSIS — D62 Acute posthemorrhagic anemia: Secondary | ICD-10-CM | POA: Diagnosis not present

## 2022-11-27 DIAGNOSIS — O1092 Unspecified pre-existing hypertension complicating childbirth: Secondary | ICD-10-CM | POA: Diagnosis present

## 2022-11-27 DIAGNOSIS — O10919 Unspecified pre-existing hypertension complicating pregnancy, unspecified trimester: Secondary | ICD-10-CM | POA: Diagnosis present

## 2022-11-27 DIAGNOSIS — Z833 Family history of diabetes mellitus: Secondary | ICD-10-CM | POA: Diagnosis not present

## 2022-11-27 DIAGNOSIS — Z3A36 36 weeks gestation of pregnancy: Secondary | ICD-10-CM

## 2022-11-27 DIAGNOSIS — O99214 Obesity complicating childbirth: Secondary | ICD-10-CM | POA: Diagnosis present

## 2022-11-27 LAB — URINE DRUG SCREEN, QUALITATIVE (ARMC ONLY)
Amphetamines, Ur Screen: NOT DETECTED
Barbiturates, Ur Screen: NOT DETECTED
Benzodiazepine, Ur Scrn: NOT DETECTED
Cannabinoid 50 Ng, Ur ~~LOC~~: NOT DETECTED
Cocaine Metabolite,Ur ~~LOC~~: NOT DETECTED
MDMA (Ecstasy)Ur Screen: NOT DETECTED
Methadone Scn, Ur: NOT DETECTED
Opiate, Ur Screen: NOT DETECTED
Phencyclidine (PCP) Ur S: NOT DETECTED
Tricyclic, Ur Screen: NOT DETECTED

## 2022-11-27 LAB — URINALYSIS, COMPLETE (UACMP) WITH MICROSCOPIC
Bilirubin Urine: NEGATIVE
Glucose, UA: NEGATIVE mg/dL
Hgb urine dipstick: NEGATIVE
Ketones, ur: 80 mg/dL — AB
Nitrite: NEGATIVE
Protein, ur: 100 mg/dL — AB
Specific Gravity, Urine: 1.029 (ref 1.005–1.030)
pH: 5 (ref 5.0–8.0)

## 2022-11-27 LAB — PROTEIN / CREATININE RATIO, URINE
Creatinine, Urine: 447 mg/dL
Protein Creatinine Ratio: 0.08 mg/mg{creat} (ref 0.00–0.15)
Total Protein, Urine: 35 mg/dL

## 2022-11-27 LAB — TYPE AND SCREEN
ABO/RH(D): A POS
Antibody Screen: NEGATIVE

## 2022-11-27 LAB — ABO/RH: ABO/RH(D): A POS

## 2022-11-27 LAB — RPR: RPR Ser Ql: NONREACTIVE

## 2022-11-27 MED ORDER — DOCUSATE SODIUM 100 MG PO CAPS
100.0000 mg | ORAL_CAPSULE | Freq: Two times a day (BID) | ORAL | Status: DC
  Administered 2022-11-28 – 2022-11-29 (×3): 100 mg via ORAL
  Filled 2022-11-27 (×3): qty 1

## 2022-11-27 MED ORDER — MISOPROSTOL 50MCG HALF TABLET
50.0000 ug | ORAL_TABLET | ORAL | Status: DC | PRN
Start: 2022-11-27 — End: 2022-11-27
  Administered 2022-11-27: 50 ug via VAGINAL
  Filled 2022-11-27 (×2): qty 1

## 2022-11-27 MED ORDER — OXYTOCIN-SODIUM CHLORIDE 30-0.9 UT/500ML-% IV SOLN: 1.0000 m[IU]/min | INTRAVENOUS | Status: DC

## 2022-11-27 MED ORDER — FENTANYL-BUPIVACAINE-NACL 0.5-0.125-0.9 MG/250ML-% EP SOLN
EPIDURAL | Status: AC
  Filled 2022-11-27: qty 250

## 2022-11-27 MED ORDER — SODIUM CHLORIDE 0.9 % IV SOLN
INTRAVENOUS | Status: DC | PRN
  Administered 2022-11-27 (×2): 8 mL via EPIDURAL

## 2022-11-27 MED ORDER — ACETAMINOPHEN 325 MG PO TABS: 650.0000 mg | ORAL_TABLET | ORAL | Status: DC | PRN

## 2022-11-27 MED ORDER — FENTANYL CITRATE (PF) 100 MCG/2ML IJ SOLN
50.0000 ug | INTRAMUSCULAR | Status: DC | PRN
  Administered 2022-11-27: 100 ug via INTRAVENOUS
  Administered 2022-11-27: 50 ug via INTRAVENOUS
  Filled 2022-11-27 (×2): qty 2

## 2022-11-27 MED ORDER — OXYTOCIN-SODIUM CHLORIDE 30-0.9 UT/500ML-% IV SOLN
INTRAVENOUS | Status: AC
  Administered 2022-11-27: 2 m[IU]/min via INTRAVENOUS
  Filled 2022-11-27: qty 500

## 2022-11-27 MED ORDER — LACTATED RINGERS IV SOLN: INTRAVENOUS | Status: DC

## 2022-11-27 MED ORDER — DIBUCAINE (PERIANAL) 1 % EX OINT: 1.0000 | TOPICAL_OINTMENT | CUTANEOUS | Status: DC | PRN

## 2022-11-27 MED ORDER — COCONUT OIL OIL: 1.0000 | TOPICAL_OIL | Status: DC | PRN

## 2022-11-27 MED ORDER — LACTATED RINGERS IV SOLN: 500.0000 mL | INTRAVENOUS | Status: DC | PRN

## 2022-11-27 MED ORDER — PENICILLIN G POT IN DEXTROSE 60000 UNIT/ML IV SOLN
3.0000 10*6.[IU] | INTRAVENOUS | Status: DC
Start: 2022-11-27 — End: 2022-11-27
  Administered 2022-11-27: 3 10*6.[IU] via INTRAVENOUS
  Filled 2022-11-27: qty 50

## 2022-11-27 MED ORDER — PHENYLEPHRINE 80 MCG/ML (10ML) SYRINGE FOR IV PUSH (FOR BLOOD PRESSURE SUPPORT): 80.0000 ug | PREFILLED_SYRINGE | INTRAVENOUS | Status: DC | PRN

## 2022-11-27 MED ORDER — LACTATED RINGERS IV SOLN
500.0000 mL | Freq: Once | INTRAVENOUS | Status: AC
  Administered 2022-11-27: 500 mL via INTRAVENOUS

## 2022-11-27 MED ORDER — MISOPROSTOL 50MCG HALF TABLET
50.0000 ug | ORAL_TABLET | Freq: Once | ORAL | Status: AC
  Administered 2022-11-27: 50 ug via VAGINAL
  Filled 2022-11-27: qty 1

## 2022-11-27 MED ORDER — BENZOCAINE-MENTHOL 20-0.5 % EX AERO
1.0000 | INHALATION_SPRAY | CUTANEOUS | Status: DC | PRN
  Filled 2022-11-27: qty 56

## 2022-11-27 MED ORDER — EPHEDRINE 5 MG/ML INJ: 10.0000 mg | INTRAVENOUS | Status: DC | PRN

## 2022-11-27 MED ORDER — MISOPROSTOL 25 MCG QUARTER TABLET
25.0000 ug | ORAL_TABLET | Freq: Once | ORAL | Status: AC
  Administered 2022-11-27: 25 ug via ORAL
  Filled 2022-11-27: qty 1

## 2022-11-27 MED ORDER — LABETALOL HCL 5 MG/ML IV SOLN: 40.0000 mg | INTRAVENOUS | Status: DC | PRN

## 2022-11-27 MED ORDER — OXYTOCIN BOLUS FROM INFUSION: 333.0000 mL | Freq: Once | INTRAVENOUS | Status: DC

## 2022-11-27 MED ORDER — SODIUM CHLORIDE 0.9 % IV SOLN
5.0000 10*6.[IU] | Freq: Once | INTRAVENOUS | Status: AC
  Administered 2022-11-27: 5 10*6.[IU] via INTRAVENOUS
  Filled 2022-11-27: qty 5

## 2022-11-27 MED ORDER — ONDANSETRON HCL 4 MG/2ML IJ SOLN: 4.0000 mg | INTRAMUSCULAR | Status: DC | PRN

## 2022-11-27 MED ORDER — FENTANYL-BUPIVACAINE-NACL 0.5-0.125-0.9 MG/250ML-% EP SOLN
12.0000 mL/h | EPIDURAL | Status: DC | PRN
  Administered 2022-11-27: 12 mL/h via EPIDURAL

## 2022-11-27 MED ORDER — WITCH HAZEL-GLYCERIN EX PADS: 1.0000 | MEDICATED_PAD | CUTANEOUS | Status: DC | PRN

## 2022-11-27 MED ORDER — ONDANSETRON HCL 4 MG PO TABS: 4.0000 mg | ORAL_TABLET | ORAL | Status: DC | PRN

## 2022-11-27 MED ORDER — HYDRALAZINE HCL 20 MG/ML IJ SOLN: 10.0000 mg | INTRAMUSCULAR | Status: DC | PRN

## 2022-11-27 MED ORDER — DIPHENHYDRAMINE HCL 50 MG/ML IJ SOLN: 12.5000 mg | INTRAMUSCULAR | Status: DC | PRN

## 2022-11-27 MED ORDER — LIDOCAINE HCL (PF) 1 % IJ SOLN
INTRAMUSCULAR | Status: DC | PRN
  Administered 2022-11-27 (×2): 3 mL via SUBCUTANEOUS
  Administered 2022-11-27: 2 mL via SUBCUTANEOUS

## 2022-11-27 MED ORDER — DIPHENHYDRAMINE HCL 25 MG PO CAPS: 25.0000 mg | ORAL_CAPSULE | Freq: Four times a day (QID) | ORAL | Status: DC | PRN

## 2022-11-27 MED ORDER — PRENATAL MULTIVITAMIN CH
1.0000 | ORAL_TABLET | Freq: Every day | ORAL | Status: DC
  Administered 2022-11-28 – 2022-11-29 (×2): 1 via ORAL
  Filled 2022-11-27 (×2): qty 1

## 2022-11-27 MED ORDER — TETANUS-DIPHTH-ACELL PERTUSSIS 5-2.5-18.5 LF-MCG/0.5 IM SUSY
0.5000 mL | PREFILLED_SYRINGE | Freq: Once | INTRAMUSCULAR | Status: DC
  Filled 2022-11-27: qty 0.5

## 2022-11-27 MED ORDER — ZOLPIDEM TARTRATE 5 MG PO TABS: 5.0000 mg | ORAL_TABLET | Freq: Every evening | ORAL | Status: DC | PRN

## 2022-11-27 MED ORDER — HYDROXYZINE HCL 25 MG PO TABS
50.0000 mg | ORAL_TABLET | Freq: Four times a day (QID) | ORAL | Status: DC | PRN
Start: 2022-11-27 — End: 2022-11-27

## 2022-11-27 MED ORDER — LIDOCAINE HCL (PF) 1 % IJ SOLN: 30.0000 mL | INTRAMUSCULAR | Status: DC | PRN

## 2022-11-27 MED ORDER — SIMETHICONE 80 MG PO CHEW
80.0000 mg | CHEWABLE_TABLET | ORAL | Status: DC | PRN
Start: 2022-11-27 — End: 2022-11-29

## 2022-11-27 MED ORDER — LIDOCAINE-EPINEPHRINE (PF) 1.5 %-1:200000 IJ SOLN
INTRAMUSCULAR | Status: DC | PRN
  Administered 2022-11-27 (×2): 3 mL via EPIDURAL

## 2022-11-27 MED ORDER — LABETALOL HCL 5 MG/ML IV SOLN: 80.0000 mg | INTRAVENOUS | Status: DC | PRN

## 2022-11-27 MED ORDER — IBUPROFEN 600 MG PO TABS
600.0000 mg | ORAL_TABLET | Freq: Four times a day (QID) | ORAL | Status: DC
  Administered 2022-11-29: 600 mg via ORAL
  Filled 2022-11-27: qty 1

## 2022-11-27 MED ORDER — ONDANSETRON HCL 4 MG/2ML IJ SOLN: 4.0000 mg | Freq: Four times a day (QID) | INTRAMUSCULAR | Status: DC | PRN

## 2022-11-27 MED ORDER — LABETALOL HCL 5 MG/ML IV SOLN
20.0000 mg | INTRAVENOUS | Status: DC | PRN
  Administered 2022-11-27 (×2): 20 mg via INTRAVENOUS
  Filled 2022-11-27 (×2): qty 4

## 2022-11-27 MED ORDER — SOD CITRATE-CITRIC ACID 500-334 MG/5ML PO SOLN: 30.0000 mL | ORAL | Status: DC | PRN

## 2022-11-27 MED ORDER — TERBUTALINE SULFATE 1 MG/ML IJ SOLN: 0.2500 mg | Freq: Once | INTRAMUSCULAR | Status: DC | PRN

## 2022-11-27 MED ORDER — OXYTOCIN-SODIUM CHLORIDE 30-0.9 UT/500ML-% IV SOLN: 2.5000 [IU]/h | INTRAVENOUS | Status: DC

## 2022-11-27 NOTE — Progress Notes (Signed)
Tracy Hopkins is a 22 y.o. G1P0000 at [redacted]w[redacted]d by ultrasound admitted for induction of labor due to Hypertension.she was initially triaged for uncontrolled hypertension, and despite additional medication , had elevated pressures. A decision was made to induce.  Subjective: She denies any headache this morning, no visual changes. Is feeling a lot of cramps.  Objective: BP 135/66   Pulse 100   Temp 97.6 F (36.4 C) (Oral)   Resp 16   LMP 03/14/2022 (Approximate) Comment: states menses was light and only 3 days No intake/output data recorded. No intake/output data recorded.  FHT:  FHR: 130 bpm, variability: moderate,  accelerations:  Present,  decelerations:  Absent UC:   irregular, every 3-5 minutes SVE:   Dilation: 5 Effacement (%): 90, 100 Station: -2 Exam by:: LSE  Labs: Lab Results  Component Value Date   WBC 11.0 (H) 11/26/2022   HGB 10.8 (L) 11/26/2022   HCT 31.2 (L) 11/26/2022   MCV 85.7 11/26/2022   PLT 253 11/26/2022    Assessment / Plan: Induction of labor due to gestational hypertension,  progressing well  with cytotec  Fetal Wellbeing:  Category I Pain Control:  Labor support without medications I/D:  n/a Anticipated MOD:  NSVD Has made significant progress. Will consider pitocin or AROM. Dr. Anastasia Fiedler updated on maternal/fetal status. Mirna Mires, CNM 11/27/2022, 11:31 AM

## 2022-11-27 NOTE — Progress Notes (Signed)
Tracy Hopkins is a 22 y.o. G1P0101 at [redacted]w[redacted]d who has received a second epidural due to the first not working well.   Subjective:   She is still somewhat uncomfortable. Can feel her contractions. Feeling much rectal pressure.  Objective: BP (!) 157/80   Pulse 93   Temp 97.6 F (36.4 C) (Oral)   Resp 20   LMP 03/14/2022 (Approximate) Comment: states menses was light and only 3 days  SpO2 100%   Breastfeeding Unknown  No intake/output data recorded. Total I/O In: -  Out: 450 [Blood:450]  FHT:  FHR: 12130 bpm, variability: moderate,  accelerations:  Present,  decelerations:  Absent UC:   regular, every 1.5-3 minutes SVE:   Anterior lip/-1 station, OA  Labs: Lab Results  Component Value Date   WBC 11.0 (H) 11/26/2022   HGB 10.8 (L) 11/26/2022   HCT 31.2 (L) 11/26/2022   MCV 85.7 11/26/2022   PLT 253 11/26/2022    Assessment / Plan: Induction of labor due to gestational hypertension,  progressing well on pitocin Anticipate setting up for delivery shortly.   Fetal Wellbeing:  Category I Pain Control:  Epidural I/D:  n/a Anticipated MOD:  NSVD  Mirna Mires, CNM 11/27/2022, 8:32 PM

## 2022-11-27 NOTE — Anesthesia Procedure Notes (Signed)
Epidural Patient location during procedure: OB Start time: 11/27/2022 6:02 PM End time: 11/27/2022 6:20 PM  Staffing Anesthesiologist: Corinda Gubler, MD Performed: anesthesiologist   Preanesthetic Checklist Completed: patient identified, IV checked, site marked, risks and benefits discussed, surgical consent, monitors and equipment checked, pre-op evaluation and timeout performed  Epidural Patient position: sitting Prep: ChloraPrep Patient monitoring: heart rate, continuous pulse ox and blood pressure Approach: midline Location: L2-L3 Injection technique: LOR saline  Needle:  Needle type: Tuohy  Needle gauge: 17 G Needle length: 9 cm Needle insertion depth: 7 cm Catheter type: closed end flexible Catheter size: 19 Gauge Catheter at skin depth: 12 cm Test dose: negative and 1.5% lidocaine with Epi 1:200 K  Assessment Sensory level: T10 Events: blood not aspirated, no cerebrospinal fluid, injection not painful, no injection resistance, no paresthesia and negative IV test  Additional Notes Second attempt at the epidural. Two skin entries attempted. Prior epidural removed with tip intact. Once again challenging due to body habitus. Pt. Evaluated and documentation done after procedure finished. Patient identified. Risks/Benefits/Options discussed with patient including but not limited to bleeding, infection, nerve damage, paralysis, failed block, incomplete pain control, headache, blood pressure changes, nausea, vomiting, reactions to medication both or allergic, itching and postpartum back pain. Confirmed with bedside nurse the patient's most recent platelet count. Confirmed with patient that they are not currently taking any anticoagulation, have any bleeding history or any family history of bleeding disorders. Patient expressed understanding and wished to proceed. All questions were answered. Sterile technique was used throughout the entire procedure. Please see nursing notes for  vital signs. Test dose was given through epidural catheter and negative prior to continuing to dose epidural or start infusion. Warning signs of high block given to the patient including shortness of breath, tingling/numbness in hands, complete motor block, or any concerning symptoms with instructions to call for help. Patient was given instructions on fall risk and not to get out of bed. All questions and concerns addressed with instructions to call with any issues or inadequate analgesia.     Patient tolerated the insertion well without immediate complications.  Reason for block: procedure for painReason for block:procedure for pain

## 2022-11-27 NOTE — Progress Notes (Signed)
Tracy Hopkins is a 22 y.o. G1P0000 at [redacted]w[redacted]d by LMP admitted for induction of labor due to Hypertension.  Subjective: Resting quietly, feeling some cramping.   Objective: BP 135/69   Pulse 93   LMP 03/14/2022 (Approximate) Comment: states menses was light and only 3 days No intake/output data recorded. No intake/output data recorded.  FHT:  FHR: 135 bpm, variability: moderate,  accelerations:  Present,  decelerations:  Absent UC:   rare SVE:   Dilation: 1.5 Effacement (%): 50 Station: -2 Exam by:: Kahleah Crass CNM  Labs: Lab Results  Component Value Date   WBC 11.0 (H) 11/26/2022   HGB 10.8 (L) 11/26/2022   HCT 31.2 (L) 11/26/2022   MCV 85.7 11/26/2022   PLT 253 11/26/2022    Assessment / Plan: Induction of labor due to Hypertension, ripening stage   Labor:  cook's balloon present, Cytotec #2 placed CHTN  treated with Labetalol IV at 0405 Fetal Wellbeing:  Category I Pain Control:   received Fentanyl at 0411 I/D:   GBS positive PCN infusing, membranes intact Anticipated MOD:  NSVD  Ellouise Newer Amay Mijangos, CNM 11/27/2022, 6:56 AM

## 2022-11-27 NOTE — Anesthesia Procedure Notes (Signed)
Epidural Patient location during procedure: OB  Staffing Anesthesiologist: Corinda Gubler, MD Performed: anesthesiologist   Preanesthetic Checklist Completed: patient identified, IV checked, site marked, risks and benefits discussed, surgical consent, monitors and equipment checked, pre-op evaluation and timeout performed  Epidural Patient position: sitting Prep: ChloraPrep Patient monitoring: heart rate, continuous pulse ox and blood pressure Approach: midline Location: L4-L5 Injection technique: LOR saline  Needle:  Needle type: Tuohy  Needle gauge: 17 G Needle length: 9 cm Needle insertion depth: 7 cm Catheter type: closed end flexible Catheter size: 19 Gauge Catheter at skin depth: 12 cm Test dose: negative and 1.5% lidocaine with Epi 1:200 K  Assessment Sensory level: T10 Events: blood not aspirated, no cerebrospinal fluid, injection not painful, no injection resistance, no paresthesia and negative IV test  Additional Notes First/one attempt - challenging placement due to body habitus. Pt. Evaluated and documentation done after procedure finished. Patient identified. Risks/Benefits/Options discussed with patient including but not limited to bleeding, infection, nerve damage, paralysis, failed block, incomplete pain control, headache, blood pressure changes, nausea, vomiting, reactions to medication both or allergic, itching and postpartum back pain. Confirmed with bedside nurse the patient's most recent platelet count. Confirmed with patient that they are not currently taking any anticoagulation, have any bleeding history or any family history of bleeding disorders. Patient expressed understanding and wished to proceed. All questions were answered. Sterile technique was used throughout the entire procedure. Please see nursing notes for vital signs. Test dose was given through epidural catheter and negative prior to continuing to dose epidural or start infusion. Warning signs of  high block given to the patient including shortness of breath, tingling/numbness in hands, complete motor block, or any concerning symptoms with instructions to call for help. Patient was given instructions on fall risk and not to get out of bed. All questions and concerns addressed with instructions to call with any issues or inadequate analgesia.     Patient tolerated the insertion well without immediate complications.  Reason for block: procedure for painReason for block:procedure for pain

## 2022-11-27 NOTE — H&P (Signed)
Tracy Hopkins is a 22 y.o. female presenting for low back pain and abdominal cramping. The low back pain began around 6pm. She has had abdominal cramping. She has noticed an increase in discharge that is yellow in color, denies vaginal irritation or odor. She did have some diarrhea earlier this morning. Denies eating anything out of the ordinary. Has  had urinary urgency and frequency, today she had some dysuria. Last had IC "a while ago". She endorse +FM, does not think she is contracting. She had  a headache earlier but currently denies headache. Visual disturbances or RUQ pain.  Tracy Hopkins has hx CHTN, she last took Procardia 60mg  around 11pm yesterday. At 2330 she had a severe range blood pressure.  Tracy Hopkins was given Procardia 60mg ,  after 1 hour her blood pressures continued to be severe. PIH labs all negative. Will admit for IOL secondary to uncontrolled CHTN.   Tracy Hopkins [facility] and  [facility] at [facility]. She was started on Procardia 30mg  at 26 wks. On 11/4 her Procardia was increased to 60mg , an US showed an EFW 2,584grams 26%.  OB History     Gravida  1   Para      Term      Preterm      AB  0   Living         SAB      IAB      Ectopic      Multiple      Live Births             Past Medical History:  Diagnosis Date   Chronic hypertension 09/15/2022   Elevated BP without diagnosis of hypertension    Maternal chronic hypertension, third trimester 11/09/2022   Patient denies medical problems    Past Surgical History:  Procedure Laterality Date   wisdom tooth exttraction     x1, local anesthesia   Family History: family history includes Asthma in her brother; Diabetes in her father, mother, and paternal grandmother; Healthy in her half-brother, half-brother, half-brother, half-sister, half-sister, half-sister, maternal grandfather, maternal grandmother, paternal grandfather, and sister; Hypertension in her  mother. Social History:  reports that she quit smoking about 7 months ago. Her smoking use included cigars. She has been exposed to tobacco smoke. She has never used smokeless tobacco. She reports that she does not currently use alcohol. She reports that she does not currently use drugs.     Maternal Diabetes: No Genetic Screening: Declined Maternal Ultrasounds/Referrals: Normal Fetal Ultrasounds or other Referrals:  None Maternal Substance Abuse:  Yes:  Type: Marijuana Significant Maternal Medications:  Meds include: Other: Procardia  Significant Maternal Lab Results:  Group B Strep positive Number of Prenatal Visits:greater than 3 verified prenatal visits Maternal Vaccinations:Covid Other Comments:  None  Review of Systems History   Blood pressure (!) 167/91, pulse (!) 101, last menstrual period 03/14/2022. Maternal Exam:  Introitus: Normal vulva.   Physical Exam Constitutional:      Appearance: Normal appearance.  Cardiovascular:     Rate and Rhythm: Normal rate and regular rhythm.     Heart sounds: Normal heart sounds.  Pulmonary:     Effort: Pulmonary effort is normal.     Breath sounds: Normal breath sounds.  Abdominal:     Comments: Gravid, EFW 6lbs Tender over lower abdomen  Genitourinary:    General: Normal vulva.     Comments: SVE 1.5/50/-2 Musculoskeletal:     Cervical back: Normal  range of motion and neck supple.  Skin:    General: Skin is warm.  Neurological:     General: No focal deficit present.  Psychiatric:        Mood and Affect: Mood normal.     EFM: baseline 135, moderate variability, pos accel, neg decel TOCO: occasional contraction Prenatal labs: ABO, Rh: A/Positive/-- (04/22 1533) Antibody: Negative (04/22 1533) Rubella: 7.31 (04/22 1533) RPR: Non Reactive (09/12 1109)  HBsAg: Negative (04/22 1533)  HIV: Non Reactive (09/12 1109)  GBS: Positive/-- (11/04 1348)   Assessment/Plan: IOL at [id number] secondary to uncontrolled  CHTN  -Cytotec placed vaginally, given orally, Cook's balloon placed -Category I tracing -GBS positive, PCN ordered  - Membranes intact -BP severe range: to be treated per protocol, PIH labs WNL -pain management: aware of all options will ask if desired  Dr Valentino Saxon aware of admission and plan  Tracy Hopkins [facility] 11/27/2022, 1:21 AM

## 2022-11-27 NOTE — Discharge Summary (Signed)
Postpartum Discharge Summary  Date of Service updated***     Patient Name: Tracy Hopkins DOB: [birth year] MRN: [id number]  Date of admission: 11/26/2022 Delivery date:11/27/2022 Delivering provider: Mirna Mires Date of discharge: 11/27/2022  Admitting diagnosis: Indication for care in labor and delivery, antepartum [O75.9] Chronic hypertension affecting pregnancy [O10.919] Intrauterine pregnancy: [redacted]w[redacted]d     Secondary diagnosis:  Principal Problem:   Indication for care in labor and delivery, antepartum Active Problems:   Chronic hypertension affecting pregnancy   Postpartum care following vaginal delivery  Additional problems: none    Discharge diagnosis: Preterm Pregnancy Delivered                                              Post partum procedures:{Postpartum procedures:23558} Augmentation: Pitocin, Cytotec, and IP Foley Complications: None  Hospital course: Induction of Labor With Vaginal Delivery   22 y.o. yo G1P0101 at [redacted]w[redacted]d was admitted to the hospital 11/26/2022 for induction of labor.  Indication for induction: Gestational hypertension not controlled with medication. Patient had an labor course complicated by nothing Membrane Rupture Time/Date: 5:00 PM,11/27/2022  Delivery Method:Vaginal, Spontaneous Operative Delivery:N/A Episiotomy: None Lacerations:  1st degree Details of delivery can be found in separate delivery note.  Patient had a postpartum course complicated by***. Patient is discharged home 11/27/22.  Newborn Data: Birth date:11/27/2022 Birth time:7:55 PM Gender:Female Living status:Living Apgars:8 ,9  Weight:2540 g  Magnesium Sulfate received: No BMZ received: No Rhophylac:N/A MMR:No T-DaP: declined antenatally Flu: {ZDG:38756} RSV Vaccine received: No Transfusion:{Transfusion received:30440034} Immunizations administered: Immunization History  Administered Date(s) Administered   [employer] Vaccine Bivalent Booster 8yrs & up  05/09/2022    Physical exam  Vitals:   11/27/22 2040 11/27/22 2045 11/27/22 2050 11/27/22 2107  BP:    (!) 153/85  Pulse:    (!) 113  Resp:    19  Temp:    98 F (36.7 C)  TempSrc:    Oral  SpO2: 100% 99% 100%    General: {Exam; general:21111117} Lochia: {Desc; appropriate/inappropriate:30686::"appropriate"} Uterine Fundus: {Desc; firm/soft:30687} Incision: {Exam; incision:21111123} DVT Evaluation: {Exam; dvt:2111122} Labs: Lab Results  Component Value Date   WBC 11.0 (H) 11/26/2022   HGB 10.8 (L) 11/26/2022   HCT 31.2 (L) 11/26/2022   MCV 85.7 11/26/2022   PLT 253 11/26/2022      Latest Ref Rng & Units 11/26/2022   10:49 PM  CMP  Glucose 70 - 99 mg/dL 95   BUN 6 - 20 mg/dL 11   Creatinine 4.33 - 1.00 mg/dL 2.95   Sodium 188 - 416 mmol/L 134   Potassium 3.5 - 5.1 mmol/L 3.6   Chloride 98 - 111 mmol/L 106   CO2 22 - 32 mmol/L 17   Calcium 8.9 - 10.3 mg/dL 9.5   Total Protein 6.5 - 8.1 g/dL 6.6   Total Bilirubin <6.0 mg/dL 0.2   Alkaline Phos 38 - 126 U/L 131   AST 15 - 41 U/L 15   ALT 0 - 44 U/L 12    Edinburgh Score:    08/01/2022    3:29 PM  Edinburgh Postnatal Depression Scale Screening Tool  I have been able to laugh and see the funny side of things. 0  I have looked forward with enjoyment to things. 0  I have blamed myself unnecessarily when things went wrong. 0  I have been anxious  or worried for no good reason. 0  I have felt scared or panicky for no good reason. 0  Things have been getting on top of me. 0  I have been so unhappy that I have had difficulty sleeping. 0  I have felt sad or miserable. 0  I have been so unhappy that I have been crying. 0  The thought of harming myself has occurred to me. 0  Edinburgh Postnatal Depression Scale Total 0      After visit meds:  Allergies as of 11/27/2022   No Known Allergies   Med Rec must be completed prior to using this [facility]***        Discharge home in stable condition Infant Feeding:  Bottle and Breast Infant Disposition:{CHL IP OB HOME WITH WUJWJX:91478} Discharge instruction: per After Visit Summary and Postpartum booklet. Activity: Advance as tolerated. Pelvic rest for 6 weeks.  Diet: routine diet Anticipated Birth Control: POPs Postpartum Appointment:1 week for blood pressure reading and visit Additional Postpartum F/U:  6 weeks PP for physical and to start birth control Future Appointments: Future Appointments  Date Time Provider Department Center  11/30/2022  1:15 PM [facility] ROOM [facility] None  11/30/2022  1:55 PM Julieanne Manson, MD [facility] None   Follow up Visit:  Follow-up Information     Mirna Mires, CNM. Schedule an appointment as soon as possible for a visit in 1 week(s).   Specialties: Obstetrics, Gynecology Why: Please make an appointment for a 1-2 week postpartum visit to check your blood pressure. Also make a 6 week post delivery appointment for a physical and to astart on birth control. Contact information: [address] [place] [address] [phone]                     11/27/2022 Mirna Mires, CNM

## 2022-11-27 NOTE — Anesthesia Preprocedure Evaluation (Signed)
Anesthesia Evaluation  Patient identified by MRN, date of birth, ID band Patient awake    Reviewed: Allergy & Precautions, NPO status , Patient's Chart, lab work & pertinent test results  History of Anesthesia Complications Negative for: history of anesthetic complications  Airway Mallampati: III  TM Distance: >3 FB Neck ROM: Full    Dental no notable dental hx. (+) Teeth Intact   Pulmonary neg sleep apnea, neg COPD, Patient abstained from smoking.Not current smoker, former smoker   Pulmonary exam normal breath sounds clear to auscultation       Cardiovascular Exercise Tolerance: Good METShypertension, Pt. on medications (-) CAD and (-) Past MI (-) dysrhythmias  Rhythm:Regular Rate:Normal - Systolic murmurs    Neuro/Psych negative neurological ROS  negative psych ROS   GI/Hepatic ,neg GERD  ,,(+)     substance abuse  marijuana use  Endo/Other  neg diabetes  Morbid obesity  Renal/GU negative Renal ROS     Musculoskeletal   Abdominal  (+) + obese  Peds  Hematology   Anesthesia Other Findings Past Medical History: 09/15/2022: Chronic hypertension No date: Elevated BP without diagnosis of hypertension 11/09/2022: Maternal chronic hypertension, third trimester No date: Patient denies medical problems  Reproductive/Obstetrics (+) Pregnancy                             Anesthesia Physical Anesthesia Plan  ASA: 3  Anesthesia Plan: Epidural   Post-op Pain Management:    Induction:   PONV Risk Score and Plan: 2 and Treatment may vary due to age or medical condition and Ondansetron  Airway Management Planned: Natural Airway  Additional Equipment:   Intra-op Plan:   Post-operative Plan:   Informed Consent: I have reviewed the patients History and Physical, chart, labs and discussed the procedure including the risks, benefits and alternatives for the proposed anesthesia with the  patient or authorized representative who has indicated his/her understanding and acceptance.       Plan Discussed with: Surgeon  Anesthesia Plan Comments: (Discussed R/B/A of neuraxial anesthesia technique with patient: - rare risks of spinal/epidural hematoma, nerve damage, infection - Risk of PDPH - Risk of itching - Risk of nausea and vomiting - Risk of poor block necessitating replacement of epidural. - Risk of allergic reactions. Patient informed about increased incidence of above perioperative risk due to high BMI. Patient understands.   Patient voiced understanding.)       Anesthesia Quick Evaluation

## 2022-11-27 NOTE — Progress Notes (Signed)
Tracy Hopkins is a 22 y.o. G1P0000 at [redacted]w[redacted]d by ultrasound admitted for induction of labor due to gestational hypertension..  Subjective: She was started on pitocin after being rechecked by her RN. Post Cook catheter, she was found to be 5 cms dilated. She is now on 31mu/min of pitocin and contracting regularly. She feels the contractions, but describes them as mild  Objective: BP (!) 146/80 (BP Location: Right Arm)   Pulse 93   Temp 97.6 F (36.4 C) (Oral)   Resp 16   LMP 03/14/2022 (Approximate) Comment: states menses was light and only 3 days No intake/output data recorded. No intake/output data recorded.  FHT:  FHR: 130 bpm, variability: moderate,  accelerations:  Present,  decelerations:  Absent UC:   regular, every 1.5-3 minutes SVE:   Dilation: 5 Effacement (%): 90, 100 Station: -2 Exam by:: LSE Pitocin running at 36mu/min Labs: Lab Results  Component Value Date   WBC 11.0 (H) 11/26/2022   HGB 10.8 (L) 11/26/2022   HCT 31.2 (L) 11/26/2022   MCV 85.7 11/26/2022   PLT 253 11/26/2022    Assessment / Plan: Induction of labor due to gestational hypertension, now on pitocin infusion per protocol Will increase pitoicn to maintain ucs q 3 minutes and moderate intensity.    Fetal Wellbeing:  Category I Pain Control:  Labor support without medications I/D:  n/a Anticipated MOD:  NSVD  Mirna Mires, CNM 11/27/2022, 2:53 PM

## 2022-11-28 LAB — CBC
HCT: 24.4 % — ABNORMAL LOW (ref 36.0–46.0)
Hemoglobin: 8.5 g/dL — ABNORMAL LOW (ref 12.0–15.0)
MCH: 30 pg (ref 26.0–34.0)
MCHC: 34.8 g/dL (ref 30.0–36.0)
MCV: 86.2 fL (ref 80.0–100.0)
Platelets: 210 10*3/uL (ref 150–400)
RBC: 2.83 MIL/uL — ABNORMAL LOW (ref 3.87–5.11)
RDW: 13.3 % (ref 11.5–15.5)
WBC: 15.8 10*3/uL — ABNORMAL HIGH (ref 4.0–10.5)
nRBC: 0 % (ref 0.0–0.2)

## 2022-11-28 LAB — URINE CULTURE: Culture: 20000 — AB

## 2022-11-28 MED ORDER — DIPHENHYDRAMINE HCL 50 MG/ML IJ SOLN: 25.0000 mg | Freq: Once | INTRAMUSCULAR | Status: DC | PRN

## 2022-11-28 MED ORDER — IRON SUCROSE 500 MG IVPB - SIMPLE MED
500.0000 mg | Freq: Once | INTRAVENOUS | Status: AC
  Administered 2022-11-28: 500 mg via INTRAVENOUS
  Filled 2022-11-28: qty 500

## 2022-11-28 MED ORDER — EPINEPHRINE PF 1 MG/ML IJ SOLN: 0.3000 mg | Freq: Once | INTRAMUSCULAR | Status: DC | PRN

## 2022-11-28 MED ORDER — SODIUM CHLORIDE 0.9 % IV SOLN: INTRAVENOUS | Status: DC | PRN

## 2022-11-28 MED ORDER — ALBUTEROL SULFATE (2.5 MG/3ML) 0.083% IN NEBU: 2.5000 mg | INHALATION_SOLUTION | Freq: Once | RESPIRATORY_TRACT | Status: DC | PRN

## 2022-11-28 MED ORDER — SODIUM CHLORIDE 0.9 % IV BOLUS: 500.0000 mL | Freq: Once | INTRAVENOUS | Status: DC | PRN

## 2022-11-28 MED ORDER — METHYLPREDNISOLONE SODIUM SUCC 125 MG IJ SOLR: 125.0000 mg | Freq: Once | INTRAMUSCULAR | Status: DC | PRN

## 2022-11-28 NOTE — Progress Notes (Signed)
Subjective:   Tracy Hopkins had a NSVB on 11/27/2022. Her labor was uncomplicated. Has had routine postpartum care. Was induced at 36wk6d due to uncontrolled CHTN with a severe BP, labs negative for preeclampsia.  Pt. Is eating, hydrating, and voiding regularly without difficulty. Has yet to have BM. She would like to breastfeed but has had trouble latching, baby has been taking formula from a bottle well. Reports small amount of vaginal bleeding, denies passing large blood clots. Has had cramping abdomen pain, but has declined ibuprofen and tylenol. Plans to use depo for contraception. Denies anxiety/depression symptoms. Endorses good support from partner and family. Reports last night after birth she was walking around her room packing up some of her things and at that point felt a little light headed and tachycardic. Has not felt that way since. Does endorse feeling very fatigued. Baby has been rooming in with mom and has not needed special care nursery care.  This morning someone from infection disease placed Bee on contact precautions for extended spectrum beta lactamase (ESBL). This was present in a urine culture in June of 2024 from an ER visit before starting OB care. She has had three urine cultures since, last one only two days ago that are all negative. Patient was confused about why she suddenly was placed on contact precautions. Messaged with infection disease who state once someone tests positive for ESBL they are placed on contact precautions when admitted in patient for one year from diagnosis state. This was explained to Tracy Hopkins. It is unclear why this was not discovered and planned for prior to today.   Objective:  Vital signs in last 24 hours: Temp:  [97.6 F (36.4 C)-98.9 F (37.2 C)] 98.5 F (36.9 C) (11/11 0837) Pulse Rate:  [91-117] 91 (11/11 0837) Resp:  [18-20] 20 (11/11 0837) BP: (127-166)/(66-93) 127/81 (11/11 0837) SpO2:  [97 %-100 %] 99 % (11/11 0837)     General: NAD Pulmonary: no increased work of breathing Breasts: soft, non-tender, nipples without breakdown Abdomen: soft, non-tender Fundus: firm, midline, at umbilicus Lochia: light rubra, no clots Perineum: no erythema or foul odor discharge, minimal edema, laceration well approximated  Extremities: no edema, no erythema, no tenderness  Results for orders placed or performed during the hospital encounter of 11/26/22 (from the past 72 hour(s))  Protein / creatinine ratio, urine     Status: None   Collection Time: 11/26/22 10:44 PM  Result Value Ref Range   Creatinine, Urine 447 mg/dL    Comment: RESULT CONFIRMED BY MANUAL DILUTION [facility]   Total Protein, Urine 35 mg/dL    Comment: NO NORMAL RANGE ESTABLISHED FOR THIS TEST   Protein Creatinine Ratio 0.08 0.00 - 0.15 mg/mg[Cre]    Comment: Performed at [facility], [address] [place], [place] [address]  Urinalysis, Complete w Microscopic -Urine, Clean Catch     Status: Abnormal   Collection Time: 11/26/22 10:44 PM  Result Value Ref Range   Color, Urine AMBER (A) YELLOW    Comment: BIOCHEMICALS MAY BE AFFECTED BY COLOR   APPearance CLOUDY (A) CLEAR   Specific Gravity, Urine 1.029 1.005 - 1.030   pH 5.0 5.0 - 8.0   Glucose, UA NEGATIVE NEGATIVE mg/dL   Hgb urine dipstick NEGATIVE NEGATIVE   Bilirubin Urine NEGATIVE NEGATIVE   Ketones, ur 80 (A) NEGATIVE mg/dL   Protein, ur 789 (A) NEGATIVE mg/dL   Nitrite NEGATIVE NEGATIVE   Leukocytes,Ua LARGE (A) NEGATIVE   RBC / HPF 0-5 0 -  5 RBC/hpf   WBC, UA 21-50 0 - 5 WBC/hpf   Bacteria, UA FEW (A) NONE SEEN   Squamous Epithelial / HPF 11-20 0 - 5 /HPF   Mucus PRESENT     Comment: Performed at [name], [address], [place], [place] [address]  Urine Culture (for pregnant, neutropenic or urologic patients or patients with an indwelling urinary catheter)     Status: Abnormal   Collection Time: 11/26/22 10:44 PM   Specimen: Urine, Clean Catch  Result  Value Ref Range   Specimen Description      URINE, CLEAN CATCH Performed at [facility], [address], [place], [place] [address]    Special Requests      NONE Performed at [facility], [address] [place], [place] [address]    Culture (A)     20,000 COLONIES/mL GROUP B STREP(S.AGALACTIAE)ISOLATED TESTING AGAINST S. AGALACTIAE NOT ROUTINELY PERFORMED DUE TO PREDICTABILITY OF AMP/PEN/VAN SUSCEPTIBILITY. Performed at [facility], [address], [place], [place] [address]    Report Status 11/28/2022 FINAL   Urine Drug Screen, Qualitative ([facility] only)     Status: None   Collection Time: 11/26/22 10:44 PM  Result Value Ref Range   Tricyclic, Ur Screen NONE DETECTED NONE DETECTED   Amphetamines, Ur Screen NONE DETECTED NONE DETECTED   MDMA (Ecstasy)Ur Screen NONE DETECTED NONE DETECTED   Cocaine Metabolite,Ur [place] NONE DETECTED NONE DETECTED   Opiate, Ur Screen NONE DETECTED NONE DETECTED   Phencyclidine (PCP) Ur S NONE DETECTED NONE DETECTED   Cannabinoid 50 Ng, Ur [place] NONE DETECTED NONE DETECTED   Barbiturates, Ur Screen NONE DETECTED NONE DETECTED   Benzodiazepine, Ur Scrn NONE DETECTED NONE DETECTED   Methadone Scn, Ur NONE DETECTED NONE DETECTED    Comment: (NOTE) Tricyclics + metabolites, urine    Cutoff 1000 ng/mL Amphetamines + metabolites, urine  Cutoff 1000 ng/mL MDMA (Ecstasy), urine              Cutoff 500 ng/mL Cocaine Metabolite, urine          Cutoff 300 ng/mL Opiate + metabolites, urine        Cutoff 300 ng/mL Phencyclidine (PCP), urine         Cutoff 25 ng/mL Cannabinoid, urine                 Cutoff 50 ng/mL Barbiturates + metabolites, urine  Cutoff 200 ng/mL Benzodiazepine, urine              Cutoff 200 ng/mL Methadone, urine                   Cutoff 300 ng/mL  The urine drug screen provides only a preliminary, unconfirmed analytical test result and should not be used for non-medical purposes. Clinical consideration and  professional judgment should be applied to any positive drug screen result due to possible interfering substances. A more specific alternate chemical method must be used in order to obtain a confirmed analytical result. Gas chromatography / mass spectrometry (GC/MS) is the preferred confirm atory method. Performed at [facility], [address] [place], [place] [address]   CBC with Differential/Platelet     Status: Abnormal   Collection Time: 11/26/22 10:49 PM  Result Value Ref Range   WBC 11.0 (H) 4.0 - 10.5 K/uL   RBC 3.64 (L) 3.87 - 5.11 MIL/uL   Hemoglobin 10.8 (L) 12.0 - 15.0 g/dL   HCT 13.2 (L) 44.0 - 10.2 %  MCV 85.7 80.0 - 100.0 fL   MCH 29.7 26.0 - 34.0 pg   MCHC 34.6 30.0 - 36.0 g/dL   RDW 45.4 09.8 - 11.9 %   Platelets 253 150 - 400 K/uL   nRBC 0.0 0.0 - 0.2 %   Neutrophils Relative % 85 %   Neutro Abs 9.2 (H) 1.7 - 7.7 K/uL   Lymphocytes Relative 11 %   Lymphs Abs 1.2 0.7 - 4.0 K/uL   Monocytes Relative 4 %   Monocytes Absolute 0.5 0.1 - 1.0 K/uL   Eosinophils Relative 0 %   Eosinophils Absolute 0.0 0.0 - 0.5 K/uL   Basophils Relative 0 %   Basophils Absolute 0.0 0.0 - 0.1 K/uL   Immature Granulocytes 0 %   Abs Immature Granulocytes 0.04 0.00 - 0.07 K/uL    Comment: Performed at [facility], [address] [place], [place] [address]  Comprehensive metabolic panel     Status: Abnormal   Collection Time: 11/26/22 10:49 PM  Result Value Ref Range   Sodium 134 (L) 135 - 145 mmol/L   Potassium 3.6 3.5 - 5.1 mmol/L   Chloride 106 98 - 111 mmol/L   CO2 17 (L) 22 - 32 mmol/L   Glucose, Bld 95 70 - 99 mg/dL    Comment: Glucose reference range applies only to samples taken after fasting for at least 8 hours.   BUN 11 6 - 20 mg/dL   Creatinine, Ser 9.56 0.44 - 1.00 mg/dL   Calcium 9.5 8.9 - 21.3 mg/dL   Total Protein 6.6 6.5 - 8.1 g/dL   Albumin 3.2 (L) 3.5 - 5.0 g/dL   AST 15 15 - 41 U/L   ALT 12 0 - 44 U/L   Alkaline Phosphatase 131 (H)  38 - 126 U/L   Total Bilirubin 0.2 <1.2 mg/dL   GFR, Estimated >08 >65 mL/min    Comment: (NOTE) Calculated using the CKD-EPI Creatinine Equation (2021)    Anion gap 11 5 - 15    Comment: Performed at [facility], [address] [place], [place] [address]  RPR     Status: None   Collection Time: 11/27/22  5:43 AM  Result Value Ref Range   RPR Ser Ql NON REACTIVE NON REACTIVE    Comment: Performed at [facility], [address], [place], [place] [address]  ABO/Rh     Status: None   Collection Time: 11/27/22  5:44 AM  Result Value Ref Range   ABO/RH(D)      A POS Performed at [facility], [address] [place], [place] [address]   Type and screen     Status: None   Collection Time: 11/27/22  8:04 AM  Result Value Ref Range   ABO/RH(D) A POS    Antibody Screen NEG    Sample Expiration      11/30/2022,2359 Performed at [facility], [address] [place], [place] [address]   CBC     Status: Abnormal   Collection Time: 11/28/22  5:34 AM  Result Value Ref Range   WBC 15.8 (H) 4.0 - 10.5 K/uL   RBC 2.83 (L) 3.87 - 5.11 MIL/uL   Hemoglobin 8.5 (L) 12.0 - 15.0 g/dL   HCT 27.2 (L) 53.6 - 64.4 %   MCV 86.2 80.0 - 100.0 fL   MCH 30.0 26.0 - 34.0 pg   MCHC 34.8 30.0 - 36.0 g/dL   RDW 03.4 74.2 - 59.5 %  Platelets 210 150 - 400 K/uL   nRBC 0.0 0.0 - 0.2 %    Comment: Performed at [facility], [address] [place], [place] [address]    Assessment:   22 y.o. G1P0101 1 day(s)  s/p NSVB Difficulty breastfeeding, primarily bottle feeding Anemia secondary to acute blood loss- hemodynamically stable and symptomatic Normotensive and mild range pressures since delivery, Bps are trending downwards- CHTN dx Pain well controlled  Plan:    With significant anemia did recommend IV iron infusion which Tracy Hopkins agreed to. Is ordered and likely to be completed today. Blood Type --/--/A POS (11/10 7322) / Rubella 7.31 (04/22 1533) /  Varicella Non-Immune Rhogam not indicated Tdap/varicella/rubella to be offered before discharge if indicated Feeding plan breast, lactation support Encouraged to continue breastfeeding, BF education on latch, position changes, cluster feeding, hunger cues, lactogenesis II, milk supply. Will be seen by [facility] for further support Education given regarding options for contraception, as well as compatibility with breast feeding if applicable.  Patient plans on Depo-Provera injections for contraception. Continued routine postpartum care, due to prematurity baby needs to stay 48 hours  Counseled on normal uterine involution and vaginal bleeding postpartum Continue to monitor blood pressures Anticipate discharge home tomorrow    Raeford Razor, FNP, CNM [facility] 11/28/2022, 2:45 PM

## 2022-11-28 NOTE — Anesthesia Postprocedure Evaluation (Signed)
Anesthesia Post Note  Patient: Tracy Hopkins  Procedure(s) Performed: AN AD HOC LABOR EPIDURAL  Patient location during evaluation: Mother Baby Anesthesia Type: Epidural Level of consciousness: awake and alert Pain management: pain level controlled Vital Signs Assessment: post-procedure vital signs reviewed and stable Respiratory status: spontaneous breathing, nonlabored ventilation and respiratory function stable Cardiovascular status: stable Postop Assessment: no headache, no backache and epidural receding Anesthetic complications: no  No notable events documented.   Last Vitals:  Vitals:   11/28/22 0330 11/28/22 0837  BP: (!) 140/74 127/81  Pulse: (!) 105 91  Resp: 20 20  Temp: 37.1 C 36.9 C  SpO2: 100% 99%    Last Pain:  Vitals:   11/28/22 0837  TempSrc: Oral  PainSc:                  Elmarie Mainland

## 2022-11-28 NOTE — Lactation Note (Signed)
This note was copied from a baby's chart. Lactation Consultation Note  Patient Name: Girl Chakakhan Hannegan [id number] Date: 11/28/2022 Age:22 hours Reason for consult: Initial assessment;Primapara;Late-preterm 34-36.6wks   Maternal Data Lactation to room for an initial assessment w/ a P1 patient and a 19hr old baby girl.  This was a SVD.  Feeding goal is breastfeeding/formula feeding.  This is a LPT infant born at [redacted]w[redacted]d.  Infant has received several bottles of formula.  Patient stated she does not have a breastpump at home.    Feeding Mother's Current Feeding Choice: Breast Milk and Formula  Lactation did not observe a feeding but patient expressed interest in receiving help later on.  Lactation Tools Discussed/Used Tools: Pump Breast pump type: Double-Electric Breast Pump Pump Education: Setup, frequency, and cleaning Reason for Pumping: LPT infant Pumping frequency: q3  LC set patient up with a DEBP and initiated pumping.  A small amt of breastmilk was noted around the edges of the flange.  LC informed patient that if she chooses not to put infant to breast and provide formula that she will need to make sure she pumps.  Patient verbalized understanding.   Interventions Interventions: Breast feeding basics reviewed;DEBP;Education  LC provided education on the following;  milk production expectations, hunger cues, day 1/2 wet/dirty diapers, hand expression, benefits of STS and arousing infant for a feeding.  Lactation informed patient of feeding infant at least 8 or more times w/in a 24hr period but not exceeding 3hrs. Patient verbalized understanding.   Education on the LPT infant protocol was discussed and LPT infant behavior.  Discharge Pump: Advised to call insurance company (Provided patient with Medicaid form to call insurance) WIC Program: Yes  Consult Status Consult Status: Follow-up Follow-up type: In-patient    Yvette Rack Thayden Lemire 11/28/2022, 4:27 PM

## 2022-11-28 NOTE — Discharge Instructions (Signed)
Discharge Instructions:   If there are any new medications, they have been ordered and will be available for pickup at the listed pharmacy on your way home from the hospital.   Call office if you have any of the following: headache, visual changes, fever >101.0 F, chills, shortness of breath, breast concerns, excessive vaginal bleeding, incision drainage or problems, leg pain or redness, depression or any other concerns. If you have vaginal discharge with an odor, let your doctor know.   It is normal to bleed for up to 6 weeks. You should not soak through more than 1 pad in 1 hour. If you have a blood clot larger than your fist with continued bleeding, call your doctor.   Activity: Do not lift > 10 lbs for 6 weeks (do not lift anything heavier than your baby). No intercourse, tampons, swimming pools, hot tubs, baths (only showers) for 6 weeks.  No driving for 1-2 weeks. Continue prenatal vitamin, especially if breastfeeding. Increase calories and fluids (water) while breastfeeding.   Your milk will come in, in the next couple of days (right now it is colostrum). You may have a slight fever when your milk comes in, but it should go away on its own.  If it does not, and rises above 101 F please call the doctor. You will also feel achy and your breasts will be firm. They will also start to leak. If you are breastfeeding, continue as you have been and you can pump/express milk for comfort.   If you have too much milk, your breasts can become engorged, which could lead to mastitis. This is an infection of the milk ducts. It can be very painful and you will need to notify your doctor to obtain a prescription for antibiotics. You can also treat it with a shower or hot/cold compress.   For concerns about your baby, please call your pediatrician.  For breastfeeding concerns, the lactation consultant can be reached at [phone].   Postpartum blues (feelings of happy one minute and sad another minute)  are normal for the first few weeks but if it gets worse let your doctor know.   Congratulations! We enjoyed caring for you and your new bundle of joy!  

## 2022-11-29 MED ORDER — WITCH HAZEL-GLYCERIN EX PADS: 1.0000 | MEDICATED_PAD | CUTANEOUS | 12 refills | Status: DC | PRN

## 2022-11-29 MED ORDER — NIFEDIPINE ER 60 MG PO TB24: 60.0000 mg | ORAL_TABLET | Freq: Every day | ORAL | 1 refills | Status: DC

## 2022-11-29 MED ORDER — DOCUSATE SODIUM 100 MG PO CAPS: 100.0000 mg | ORAL_CAPSULE | Freq: Two times a day (BID) | ORAL | 1 refills | Status: DC

## 2022-11-29 MED ORDER — ACCRUFER 30 MG PO CAPS: 30.0000 mg | ORAL_CAPSULE | Freq: Two times a day (BID) | ORAL | 2 refills | Status: DC

## 2022-11-29 MED ORDER — DIBUCAINE (PERIANAL) 1 % EX OINT: 1.0000 | TOPICAL_OINTMENT | CUTANEOUS | 1 refills | Status: DC | PRN

## 2022-11-29 MED ORDER — IBUPROFEN 600 MG PO TABS: 600.0000 mg | ORAL_TABLET | Freq: Four times a day (QID) | ORAL | 1 refills | Status: DC

## 2022-11-29 MED ORDER — ACETAMINOPHEN 325 MG PO TABS: 650.0000 mg | ORAL_TABLET | ORAL | 1 refills | Status: DC | PRN

## 2022-11-29 MED ORDER — SIMETHICONE 80 MG PO CHEW: 80.0000 mg | CHEWABLE_TABLET | ORAL | 0 refills | Status: DC | PRN

## 2022-11-29 MED ORDER — BENZOCAINE-MENTHOL 20-0.5 % EX AERO: 1.0000 | INHALATION_SPRAY | CUTANEOUS | Status: DC | PRN

## 2022-11-29 NOTE — Progress Notes (Signed)
Pt declines varicella vaccine at this time.

## 2022-11-29 NOTE — Progress Notes (Signed)
Patient discharged home with family.  Discharge instructions, when to follow up, and prescriptions reviewed with patient.  Patient verbalized understanding. Patient will be escorted out by auxiliary.   

## 2022-11-30 ENCOUNTER — Other Ambulatory Visit: Payer: Medicaid Other

## 2022-11-30 ENCOUNTER — Encounter: Payer: Medicaid Other | Admitting: Obstetrics

## 2023-01-25 ENCOUNTER — Telehealth: Payer: Self-pay | Admitting: Licensed Practical Nurse

## 2023-01-25 ENCOUNTER — Ambulatory Visit: Payer: Medicaid Other | Admitting: Licensed Practical Nurse

## 2023-01-25 NOTE — Telephone Encounter (Signed)
 Reached out to pt to reschedule 6 week pp visit that was scheduled on 01/25/2023 at 3:35 with LMD.  Left message for pt to call back.

## 2023-01-26 ENCOUNTER — Encounter: Payer: Self-pay | Admitting: Licensed Practical Nurse

## 2023-01-26 NOTE — Telephone Encounter (Signed)
 Reached out to pt (2x) to reschedule 6 week pp visit that was scheduled on 01/25/03 at 3:35 with LMD.  Left message for pt to call back.  Will send a MyChart letter.

## 2023-06-10 IMAGING — US US PELVIS COMPLETE TRANSABD/TRANSVAG W DUPLEX AND/OR DOPPLER
1 series · 13 of 25 positions shown · non-contrast
Comparison: None.

CLINICAL DATA: Pelvic pain.

EXAM:
TRANSABDOMINAL AND TRANSVAGINAL ULTRASOUND OF PELVIS
DOPPLER ULTRASOUND OF OVARIES
TECHNIQUE: Both transabdominal and transvaginal ultrasound examinations of the
pelvis were performed. Transabdominal technique was performed for
global imaging of the pelvis including uterus, ovaries, adnexal
regions, and pelvic cul-de-sac.
It was necessary to proceed with endovaginal exam following the
transabdominal exam to visualize the endometrium and ovaries. Color
and duplex Doppler ultrasound was utilized to evaluate blood flow to
the ovaries.

[Series 1: us pelvic complete w transvaginal and torsion righ · 13 of 100 slices shown]
[im 1/100]
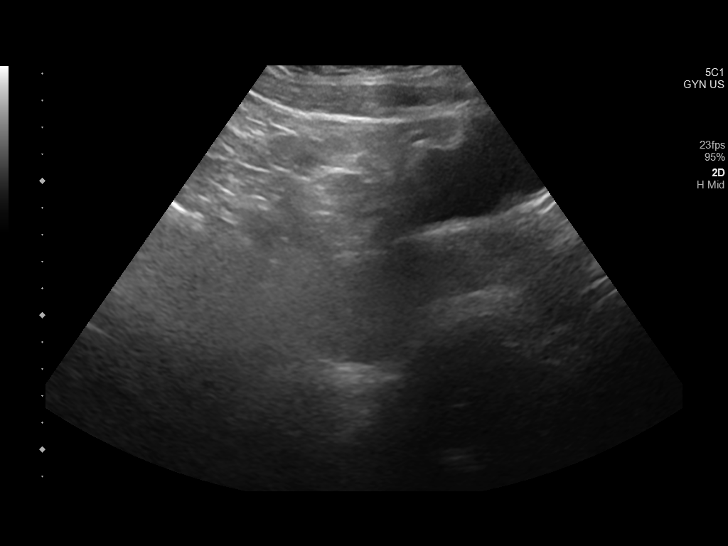
[im 9/100]
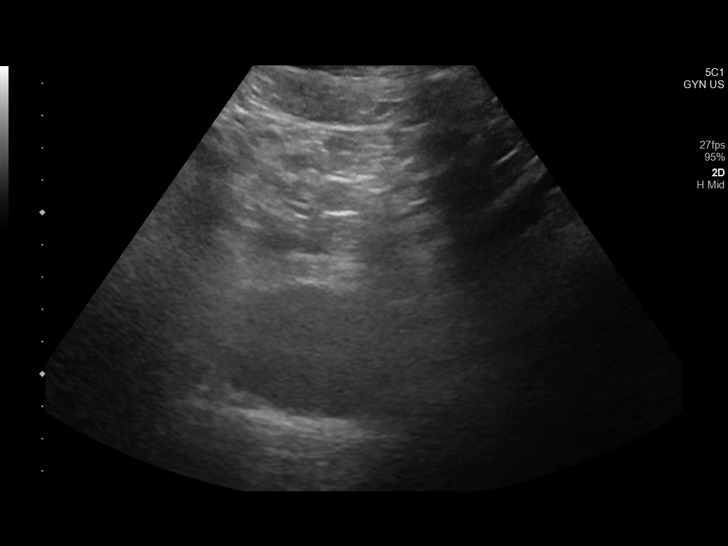
[im 17/100]
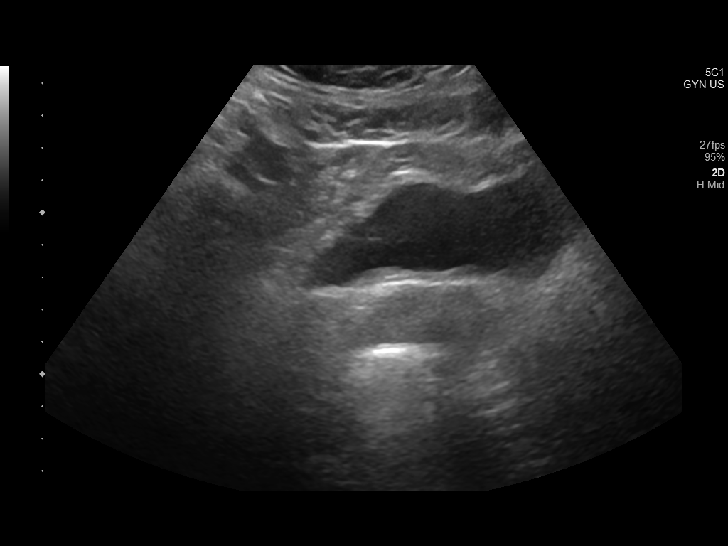
[im 25/100]
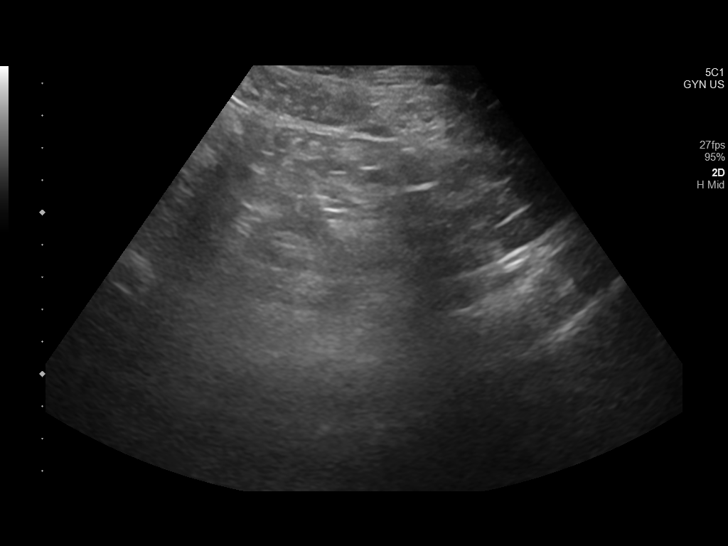
[im 34/100]
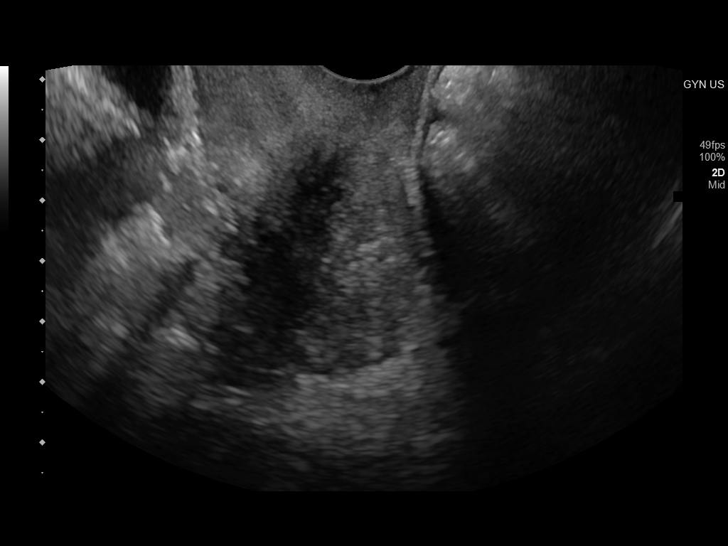
[im 42/100]
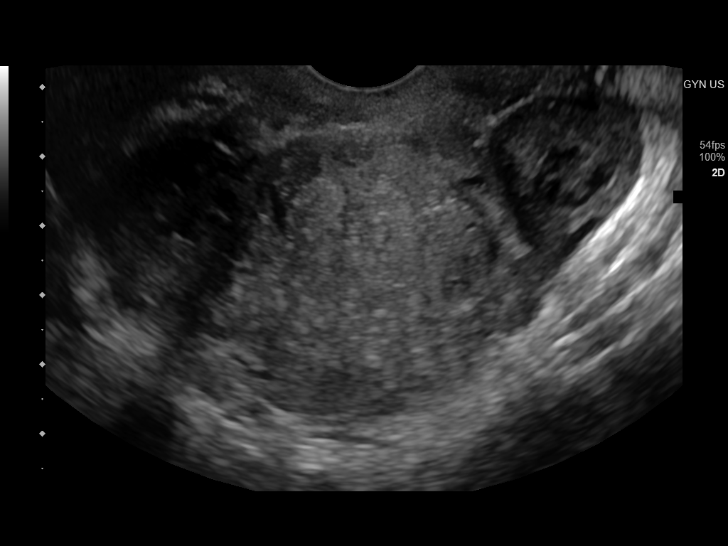
[im 50/100]
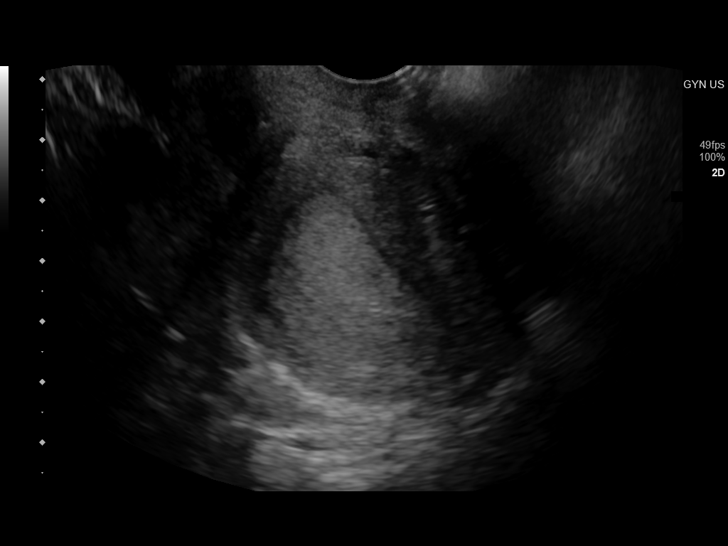
[im 58/100]
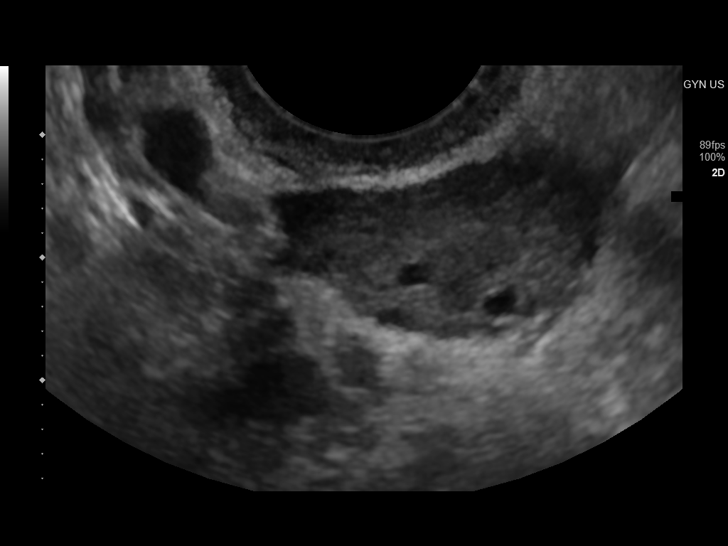
[im 67/100]
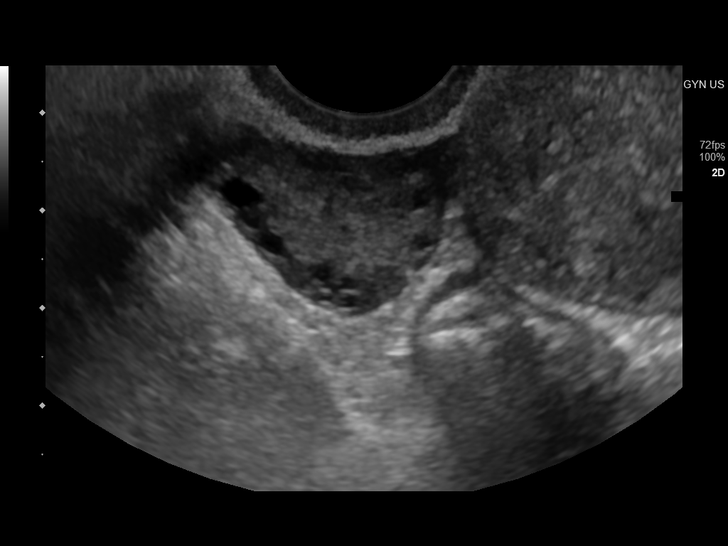
[im 75/100]
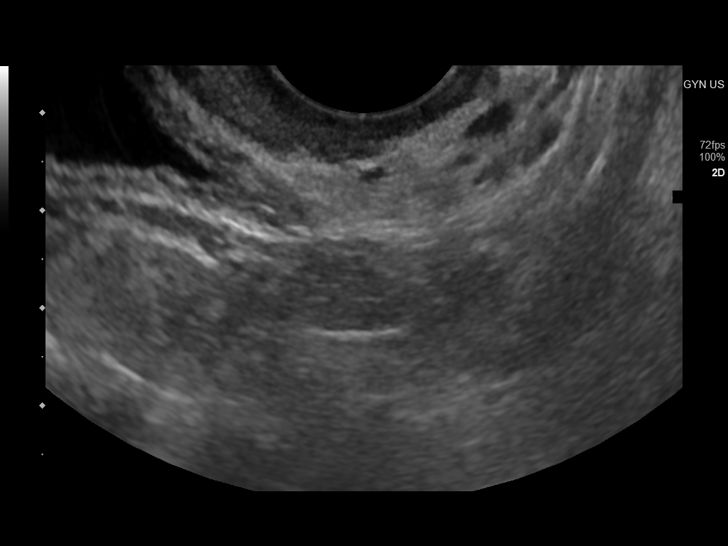
[im 83/100]
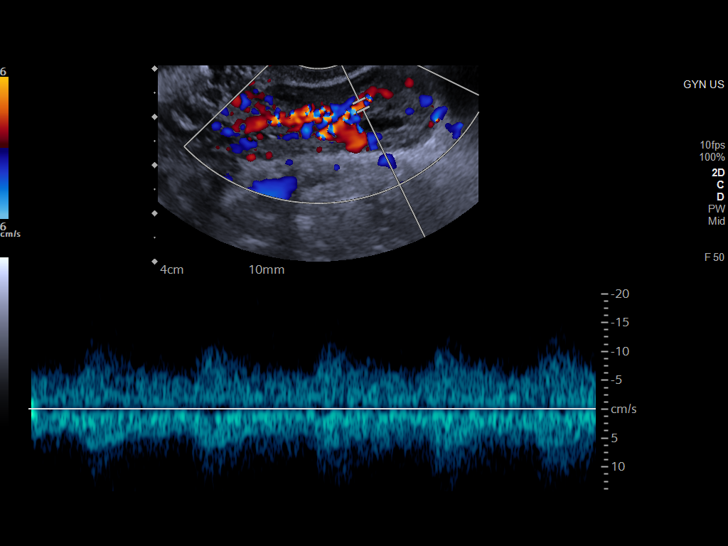
[im 91/100]
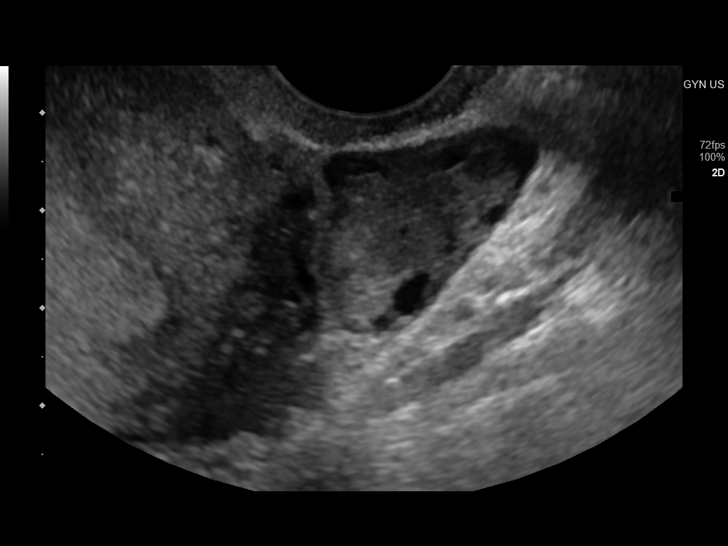
[im 100/100]
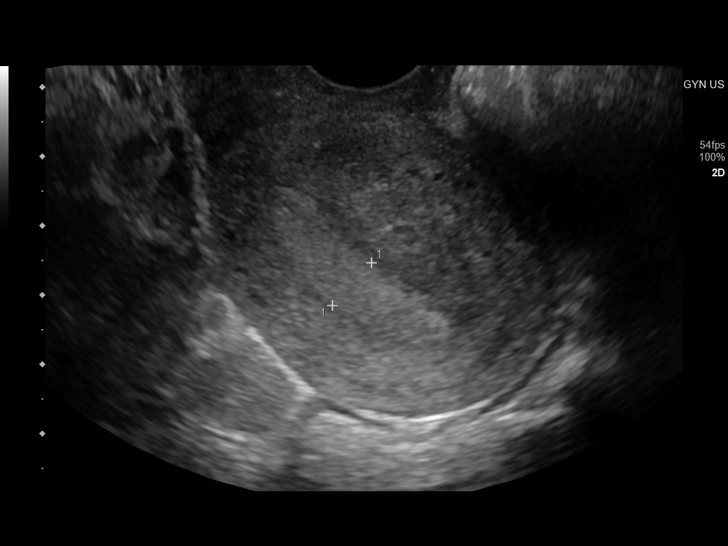

[13 of 25 positions shown; findings below may reference images not displayed]

FINDINGS: Uterus

Measurements: 6.4 x 4.4 x 4.6 cm = volume: 67 mL. The uterus is
retroverted. No focal mass.

Endometrium

Thickness: 8 mm.  No focal abnormality visualized.

Right ovary

Measurements: 3.0 x 1.5 x 4.1 cm = volume: 5.0 mL. Normal
appearance/no adnexal mass.

Left ovary

Measurements: 4.0 x 2.1 x 1.9 cm = volume: 8.1 mL. There is a 2 cm
complex/hemorrhagic corpus luteum in the left ovary.

Pulsed Doppler evaluation of both ovaries demonstrates normal
low-resistance arterial and venous waveforms.

Other findings

No abnormal free fluid.
IMPRESSION: A 2 cm complex/hemorrhagic corpus luteum in the left ovary,
otherwise unremarkable pelvic ultrasound.

## 2023-10-26 ENCOUNTER — Ambulatory Visit

## 2023-11-20 NOTE — Progress Notes (Unsigned)
    NURSE VISIT NOTE  Subjective:    Patient ID: Tracy Hopkins, female    DOB: Apr 20, 2000, 23 y.o.   MRN: [id number]  HPI  Patient is a 23 y.o. G20P0101 female who presents for evaluation of amenorrhea. She believes she could be pregnant. {pregnancy desire:14500::Pregnancy is desired.} Sexual Activity: {sexual partners:705}. Current symptoms also include: {preg sx:18128}. Last period was {norm/abn:16337}.    Objective:    There were no vitals taken for this visit.  Lab Review  No results found for any visits on 11/21/23.  Assessment:   No diagnosis found.  Plan:   {AOB + PREGNANCY PLAN:28596:p}  {AOB - PREGNANCY PLAN:28597:p}    Camelia Fetters, CMA [facility]

## 2023-11-20 NOTE — Patient Instructions (Signed)
 Commonly Asked Questions During Pregnancy  Cats: A parasite can be excreted in cat feces.  To avoid exposure you need to have another person empty the little box.  If you must empty the litter box you will need to wear gloves.  Wash your hands after handling your cat.  This parasite can also be found in raw or undercooked meat so this should also be avoided.  Colds, Sore Throats, Flu: Please check your medication sheet to see what you can take for symptoms.  If your symptoms are unrelieved by these medications please call the office.  Dental Work: Most any dental work Agricultural consultant recommends is permitted.  X-rays should only be taken during the first trimester if absolutely necessary.  Your abdomen should be shielded with a lead apron during all x-rays.  Please notify your provider prior to receiving any x-rays.  Novocaine is fine; gas is not recommended.  If your dentist requires a note from us  prior to dental work please call the office and we will provide one for you.  Exercise: Exercise is an important part of staying healthy during your pregnancy.  You may continue most exercises you were accustomed to prior to pregnancy.  Later in your pregnancy you will most likely notice you have difficulty with activities requiring balance like riding a bicycle.  It is important that you listen to your body and avoid activities that put you at a higher risk of falling.  Adequate rest and staying well hydrated are a must!  If you have questions about the safety of specific activities ask your provider.    Exposure to Children with illness: Try to avoid obvious exposure; report any symptoms to us  when noted,  If you have chicken pos, red measles or mumps, you should be immune to these diseases.   Please do not take any vaccines while pregnant unless you have checked with your OB provider.  Fetal Movement: After 28 weeks we recommend you do kick counts twice daily.  Lie or sit down in a calm quiet environment and  count your baby movements kicks.  You should feel your baby at least 10 times per hour.  If you have not felt 10 kicks within the first hour get up, walk around and have something sweet to eat or drink then repeat for an additional hour.  If count remains less than 10 per hour notify your provider.  Fumigating: Follow your pest control agent's advice as to how long to stay out of your home.  Ventilate the area well before re-entering.  Hemorrhoids:   Most over-the-counter preparations can be used during pregnancy.  Check your medication to see what is safe to use.  It is important to use a stool softener or fiber in your diet and to drink lots of liquids.  If hemorrhoids seem to be getting worse please call the office.   Hot Tubs:  Hot tubs Jacuzzis and saunas are not recommended while pregnant.  These increase your internal body temperature and should be avoided.  Intercourse:  Sexual intercourse is safe during pregnancy as long as you are comfortable, unless otherwise advised by your provider.  Spotting may occur after intercourse; report any bright red bleeding that is heavier than spotting.  Labor:  If you know that you are in labor, please go to the hospital.  If you are unsure, please call the office and let us  help you decide what to do.  Lifting, straining, etc:  If your job requires heavy  lifting or straining please check with your provider for any limitations.  Generally, you should not lift items heavier than that you can lift simply with your hands and arms (no back muscles)  Painting:  Paint fumes do not harm your pregnancy, but may make you ill and should be avoided if possible.  Latex or water based paints have less odor than oils.  Use adequate ventilation while painting.  Permanents & Hair Color:  Chemicals in hair dyes are not recommended as they cause increase hair dryness which can increase hair loss during pregnancy.   Highlighting and permanents are allowed.  Dye may be  absorbed differently and permanents may not hold as well during pregnancy.  Sunbathing:  Use a sunscreen, as skin burns easily during pregnancy.  Drink plenty of fluids; avoid over heating.  Tanning Beds:  Because their possible side effects are still unknown, tanning beds are not recommended.  Ultrasound Scans:  Routine ultrasounds are performed at approximately 20 weeks.  You will be able to see your baby's general anatomy an if you would like to know the gender this can usually be determined as well.  If it is questionable when you conceived you may also receive an ultrasound early in your pregnancy for dating purposes.  Otherwise ultrasound exams are not routinely performed unless there is a medical necessity.  Although you can request a scan we ask that you pay for it when conducted because insurance does not cover  patient request scans.  Work: If your pregnancy proceeds without complications you may work until your due date, unless your physician or employer advises otherwise.  Round Ligament Pain/Pelvic Discomfort:  Sharp, shooting pains not associated with bleeding are fairly common, usually occurring in the second trimester of pregnancy.  They tend to be worse when standing up or when you remain standing for long periods of time.  These are the result of pressure of certain pelvic ligaments called round ligaments.  Rest, Tylenol  and heat seem to be the most effective relief.  As the womb and fetus grow, they rise out of the pelvis and the discomfort improves.  Please notify the office if your pain seems different than that described.  It may represent a more serious condition.  Morning Sickness Morning sickness is when you throw up or feel like you may throw up during pregnancy. This condition often occurs in the morning, but it can also occur at any time of day. Morning sickness is most common during the first three months of pregnancy, but it can go on throughout the pregnancy. Morning  sickness is usually harmless. But if you throw up all the time, you should see your health care provider. You may also hear this condition called nausea and vomiting of pregnancy. What are the causes? The cause of morning sickness is not known. It may be linked to changes in hormones during pregnancy. What increases the risk? You're more likely to have morning sickness if: You had morning sickness in another pregnancy. You're pregnant with more than one baby, such as twins. You had morning sickness in other pregnancies. You have had motion sickness before you were pregnant. You have had bad headaches or migraines before you were pregnant. What are the signs or symptoms? Symptoms of morning sickness include: Feeling like you may throw up. Throwing up. How is this diagnosed? Morning sickness is diagnosed based on your symptoms. How is this treated? Treatment is usually not needed for morning sickness. You may only need to  change what you eat. In some cases, your provider may give you: Vitamin B6 supplements. Medicines to prevent throwing up. Ginger. Follow these instructions at home: Medicines Take your medicines only as told by your provider. Do not use any prescription, over-the-counter, or herbal medicines for morning sickness without first talking with your provider. Take prenatal vitamins. These can stop or lessen the symptoms of morning sickness. If you feel like you may throw up after taking prenatal vitamins, take them at night or with a snack. Eating and drinking     Eat dry toast or crackers before getting out of bed. Eat 5 or 6 small meals a day. Try ginger ale made with real ginger, ginger tea, or ginger candies. Drink fluids throughout the day. Eat protein foods when you need a snack. Nuts, yogurt, and cheese are good choices. Eat dry and bland foods like rice or baked potatoes. Foods that are high in carbohydrates are often helpful. Have someone cook for you if the  smell of food makes you want to throw up. Foods to avoid Greasy foods. Fatty foods. Spicy foods. General instructions Try to avoid smells that make you feel sick. Use an air purifier to keep the air in your house free of smells. Try using an acupressure wristband. This is a wristband that's used to treat motion sickness. Try acupuncture. In this treatment, a provider puts thin needles into certain areas of your body to make you feel better. Brush your teeth after throwing up or rinse with a mix of baking soda and water. The acid in throw-up can hurt your teeth. Contact a health care provider if: Your symptoms do not get better. You feel dizzy or light-headed. You're losing weight. Get help right away if: The feeling that you may throw up will not go away, or you can't stop throwing up. You faint. You have very bad pain in your belly. This information is not intended to replace advice given to you by your health care provider. Make sure you discuss any questions you have with your health care provider. Document Revised: 10/06/2022 Document Reviewed: 04/14/2022 Elsevier Patient Education  2024 [employer]First Trimester of Pregnancy  The first trimester of pregnancy starts on the first day of your last monthly period until the end of week 13. This is months 1 through 3 of pregnancy. A week after a sperm fertilizes an egg, the egg will implant into the wall of the uterus and begin to develop into a baby. Body changes during your first trimester Your body goes through many changes during pregnancy. The changes usually return to normal after your baby is born. Physical changes Your breasts may grow larger and may hurt. The area around your nipples may get darker. Your periods will stop. Your hair and nails may grow faster. You may pee more often. Health changes You may tire easily. Your gums may bleed and may be sensitive when you brush and floss. You may not feel hungry. You may  have heartburn. You may throw up or feel like you may throw up. You may want to eat some foods, but not others. You may have headaches. You may have trouble pooping (constipation). Other changes Your emotions may change from day to day. You may have more dreams. Follow these instructions at home: Medicines Talk to your health care provider if you're taking medicines. Ask if the medicines are safe to take during pregnancy. Your provider may change the medicines that you take. Do not take any medicines  unless told to by your provider. Take a prenatal vitamin that has at least 600 micrograms (mcg) of folic acid. Do not use herbal medicines, illegal substances, or medicines that are not approved by your provider. Eating and drinking While you're pregnant your body needs extra food for your growing baby. Talk with your provider about what to eat while pregnant. Activity Most women are able to exercise during pregnancy. Exercises may need to change as your pregnancy goes on. Talk to your provider about your activities and exercise routines. Relieving pain and discomfort Wear a good, supportive bra if your breasts hurt. Rest with your legs raised if you have leg cramps or low back pain. Safety Wear your seatbelt at all times when you're in a car. Talk to your provider if someone hits you, hurts you, or yells at you. Talk with your provider if you're feeling sad or have thoughts of hurting yourself. Lifestyle Certain things can be harmful while you're pregnant. Follow these rules: Do not use hot tubs, steam rooms, or saunas. Do not douche. Do not use tampons or scented pads. Do not drink alcohol,smoke, vape, or use products with nicotine or tobacco in them. If you need help quitting, talk with your provider. Avoid cat litter boxes and soil used by cats. These things carry germs that can cause harm to your pregnancy and your baby. General instructions Keep all follow-up visits. It helps you  and your unborn baby stay as healthy as possible. Write down your questions. Take them to your visits. Your provider will: Talk with you about your overall health. Give you advice or refer you to specialists who can help with different needs, including: Prenatal education classes. Mental health and counseling. Foods and healthy eating. Ask for help if you need help with food. Call your dentist and ask to be seen. Brush your teeth with a soft toothbrush. Floss gently. Where to find more information American Pregnancy Association: americanpregnancy.org [employer]: acog.org Office on [employer] Health: [employer] Contact a health care provider if: You feel dizzy, faint, or have a fever. You vomit or have watery poop (diarrhea) for 2 days or more. You have abnormal discharge or bleeding from your vagina. You have pain when you pee or your pee smells bad. You have cramps, pain, or pressure in your belly area. Get help right away if: You have trouble breathing or chest pain. You have any kind of injury, such as from a fall or a car crash. These symptoms may be an emergency. Get help right away. Call 911. Do not wait to see if the symptoms will go away. Do not drive yourself to the hospital. This information is not intended to replace advice given to you by your health care provider. Make sure you discuss any questions you have with your health care provider. Document Revised: 10/06/2022 Document Reviewed: 05/06/2022 Elsevier Patient Education  2024 [employer].

## 2023-11-21 ENCOUNTER — Ambulatory Visit (INDEPENDENT_AMBULATORY_CARE_PROVIDER_SITE_OTHER)

## 2023-11-21 VITALS — BP 129/74 | HR 91 | Resp 16 | Ht 67.0 in | Wt 288.5 lb

## 2023-11-21 DIAGNOSIS — O3680X0 Pregnancy with inconclusive fetal viability, not applicable or unspecified: Secondary | ICD-10-CM

## 2023-11-21 DIAGNOSIS — Z3201 Encounter for pregnancy test, result positive: Secondary | ICD-10-CM

## 2023-11-21 DIAGNOSIS — N912 Amenorrhea, unspecified: Secondary | ICD-10-CM

## 2023-11-21 LAB — POCT URINE PREGNANCY: Preg Test, Ur: POSITIVE — AB

## 2023-11-27 ENCOUNTER — Telehealth (INDEPENDENT_AMBULATORY_CARE_PROVIDER_SITE_OTHER)

## 2023-11-27 DIAGNOSIS — Z3480 Encounter for supervision of other normal pregnancy, unspecified trimester: Secondary | ICD-10-CM | POA: Insufficient documentation

## 2023-11-27 NOTE — Progress Notes (Signed)
 New OB Intake  I connected with  Tracy Hopkins on 11/27/23 at  3:15 PM EST by telephone Video Visit and verified that I am speaking with the correct person using two identifiers. Nurse is located at [facility] and pt is located at HOME.  I discussed the limitations, risks, security and privacy concerns of performing an evaluation and management service by telephone and the availability of in person appointments. I also discussed with the patient that there may be a patient responsible charge related to this service. The patient expressed understanding and agreed to proceed.  I explained I am completing New OB Intake today. We discussed her EDD of 03/26/2024 that is based on LMP of 06/20/2023. Pt is G2/P1. I reviewed her allergies, medications, Medical/Surgical/OB history, and appropriate screenings. There are cats in the home: no. If yes. Based on history, this is a/an pregnancy complicated by hypertension . Her obstetrical history is significant for DELIVERED AT 36 WEEKS DUE TO BP.  Patient Active Problem List   Diagnosis Date Noted   Chronic hypertension affecting pregnancy 11/27/2022   Postpartum care following vaginal delivery 11/27/2022   Indication for care in labor and delivery, antepartum 11/26/2022   Maternal varicella, non-immune 11/17/2022   Maternal chronic hypertension, third trimester 11/09/2022   Marijuana use during pregnancy; +UDS MJ 05/09/22 05/13/2022   Abnormal glucose in pregnancy, antepartum 05/13/2022   Elevated blood pressure affecting pregnancy, antepartum 05/09/2022   Supervision of high-risk pregnancy 05/06/2022   Obesity affecting pregnancy 11/01/2018    Concerns addressed today:   Delivery Plans:  Plans to deliver at [facility].  Anatomy US  Explained first scheduled US  . Anatomy US  will be scheduled around [redacted] weeks gestational age.  Labs Discussed genetic screening with patient. Patient WOULD genetic testing to be drawn at new OB visit.  Discussed possible labs to be drawn at new OB appointment.  COVID Vaccine Patient has had COVID vaccine.   Social Determinants of Health Food Insecurity: denies food insecurity WIC Referral: Patient is not interested [facility] to [facility]  Transportation: Patient denies transportation needs. Childcare: Discussed no children allowed at ultrasound appointments.   First visit review I reviewed new OB appt with pt. I explained she will have blood work and pap smear/pelvic exam if indicated. Explained pt will be seen by JANE GLEDHILL CNM  at first visit; encounter routed to appropriate provider.   Monserath Neff H Ayriana Wix, CMA 11/27/2023  3:24 PM

## 2023-11-29 ENCOUNTER — Other Ambulatory Visit: Payer: Self-pay | Admitting: Certified Nurse Midwife

## 2023-11-29 ENCOUNTER — Other Ambulatory Visit

## 2023-11-29 DIAGNOSIS — Z3A23 23 weeks gestation of pregnancy: Secondary | ICD-10-CM

## 2023-11-29 DIAGNOSIS — Z3689 Encounter for other specified antenatal screening: Secondary | ICD-10-CM | POA: Diagnosis not present

## 2023-11-29 DIAGNOSIS — N912 Amenorrhea, unspecified: Secondary | ICD-10-CM

## 2023-11-29 DIAGNOSIS — O3680X0 Pregnancy with inconclusive fetal viability, not applicable or unspecified: Secondary | ICD-10-CM

## 2023-11-30 NOTE — Patient Instructions (Incomplete)
 Prenatal Care Prenatal care is health care you get when pregnant. It helps you and your unborn baby stay as healthy as possible. Start prenatal care early in your pregnancy and continue to go to visits during your pregnancy. Prenatal care may be given by a midwife, a family practice doctor, a publishing rights manager, physician assistant, or a childbirth and pregnancy doctor. What are the benefits of prenatal care? In prenatal care, your health care provider will get to know your medical history. You'll be checked for conditions that might affect you and your baby. Prenatal care will: Lower the risk for problems as your child grows. Lower certain risks for your baby, especially the risk that: Your child may be born early. Your child will have a low weight at birth. What can I expect at the first prenatal care visit? Your first visit will likely be the longest. You should ask to be seen as soon as you know you're pregnant. The first visit is a good time to talk about any questions or concerns. Make a list of questions to ask your provider at your visits. Medical history At your visit, you and your provider will talk about your medical history, including: Your family's medical history and the medical history of the baby's father. Any past pregnancies and long-term (chronic) health conditions. Any surgeries or procedures you have had. All medicines you're taking. Tell them if you're taking herbs or supplements too. Any tobacco, alcohol, or drug use. Other problems that may harm you and your baby. Tell them if: You need food or housing. You have been around chemicals or radiation. Your partner yells at you, hits you, or hurts you. Tests and screenings Your provider will: Do a physical exam, including a pelvic and breast exam. Do tests to check for: Urinary tract infection (UTI). Sexually transmitted infections (STIs). Low iron  levels in your blood. This is called anemia. Blood type and certain  proteins on red blood cells called Rh antibodies. Infections and immunity to viruses, such as hepatitis B and rubella. HIV. Ask your provider if you need to be checked for genetic diseases. Tips about staying healthy Your provider will also give you information about how to keep yourself and your baby healthy, including: Nutrition, vitamins, and food safety. Physical activity. How to treat some problems, such as morning sickness. How to avoid infections and substances that may harm your baby. Caring for your teeth. Work and travel. Problems that require you to call your provider. How often will I have prenatal care visits? After your first prenatal care visit, you will have regular visits throughout your pregnancy. You may visit your provider as follows: Up to week 28 of pregnancy: once every 4 weeks. 28-36 weeks: once every 2 weeks. After 36 weeks: every week until delivery. Some people may have more visits. Others may have fewer. It all depends on your health and that of your baby. Keep all prenatal visits. This is one way for you and your baby to stay as healthy as possible. What happens during routine prenatal care visits? Your provider will: Check your weight and blood pressure. Check your baby's heart sounds. Ask questions about your diet, exercise, sleeping patterns, and whether you can feel the baby move. Ask about any pregnancy symptoms you're having and how you're dealing with them. Tell your provider if: You throw up or feel like you may throw up. You have discharge or you bleed from your vagina. You have trouble pooping (constipation). You have swelling, headaches, or trouble  seeing. You are very tired, or you feel sad and anxious all the time. You have discomfort, including back pain or pain in the pelvis. Tell you problems to watch for during your pregnancy, including signs of labor. Measure the height of your uterus in your belly. This is called fundal height. What  tests might I have during prenatal care visits? You may have blood, urine, and imaging tests. These may include: Urine tests to check for blood sugar, protein, or signs of infection. Genetic testing. Ultrasounds to check your baby's growth, development, and well-being. Your baby may also be checked for congenital conditions. Glucose tests to check for gestational diabetes. This is a form of diabetes that a person can get when pregnant. A test to check for group B strep (GBS) infection. What else can I expect during prenatal care visits? Your provider may give you some vaccines. Getting certain vaccines during pregnancy can protect your baby after birth. These may include: A flu shot. Tdap (tetanus, diphtheria, pertussis) vaccine. A COVID-19 vaccine. A RSV vaccine. Later in your pregnancy, your provider may talk to you about: Childbirth and childbirth classes. Breastfeeding and breastfeeding classes. Birth control after your baby is born. Where to find more information Office on Women's Health: [place] American Pregnancy Association: americanpregnancy.org March of Dimes: marchofdimes.org This information is not intended to replace advice given to you by your health care provider. Make sure you discuss any questions you have with your health care provider. Document Revised: 05/09/2022 Document Reviewed: 05/09/2022 Elsevier Patient Education  2024 [employer] Pregnancy: Healthy Eating While you're pregnant, your body needs extra nutrition for your growing baby. You also need more vitamins and minerals, such as folic acid, calcium, iron , and vitamin D. Eating a balanced diet is important for both you and your baby. Your need for extra calories will change during pregnancy. During the first 3 months of pregnancy, called the first trimester, you don't need more calories. During the second trimester, you'll need about 340 extra calories a day. During the third trimester, you'll need  about 450 extra calories a day. If you're carrying more than one baby, talk with your health care provider or a dietitian to learn more about your specific eating needs. What are tips for eating healthy during pregnancy? Meal planning  Eating smaller meals throughout the day may help manage some side effects common in pregnancy, like heartburn and reflux. Eat a variety of foods. Be sure to include many types of fruits and vegetables. Two or more servings of fish are recommended each week. Choose fish that are lower in mercury, such as salmon and pollock. Limit foods that have empty calories. These are foods that have little nutritional value, such as sweets, desserts, candies, and drinks with sugar in them. Drinks that have caffeine are OK to drink, but it's better to avoid caffeine. Limit your total caffeine intake to less than 200 mg each day, or the limit you're told by your provider. Be aware that 200 mg of caffeine is 12 oz or 355 mL of coffee, tea, or soda. General information Take a prenatal vitamin to help meet your vitamin and mineral needs during pregnancy. This includes your need for folic acid, iron , calcium, and vitamin D. Do not try to lose weight or go on a diet during pregnancy. Food safety  Wash your hands before you eat and after you prepare raw meat. Wash all fruits and vegetables well before peeling or eating. Make sure that all meats, poultry, and eggs  are cooked to food-safe temperatures or well-done. Taking these actions can help keep your food safe and protect you and your baby from dangerous food illnesses. Ask your provider for more information. What foods should I eat? Fruits All fruits. Eat a variety of colors and types of fruit. Remember to wash your fruits well before peeling or eating. Vegetables All vegetables. Eat a variety of colors and types of vegetables. Remember to wash your vegetables well before peeling or eating. Grains All grains. Choose whole  grains, such as whole-wheat bread, oatmeal, or brown rice. Meats and other protein foods Lean meats, including chicken, turkey, and lean cuts of beef, veal, or pork. Fish that is higher in omega-3 fatty acids and lower in mercury, such as salmon, herring, mussels, trout, sardines, pollock, shrimp, crab, and lobster. Tofu. Tempeh. Beans. Eggs. Peanut butter and other nut butters. Dairy Pasteurized milk and milk alternatives, such as soy milk. Pasteurized yogurt and pasteurized cheese. Cottage cheese. Sour cream. Beverages Water. Juices that contain 100% fruit juice or vegetable juice. Caffeine-free teas and decaffeinated coffee. Fats and oils Fats and oils are OK to include in moderation. Sweets and desserts Sweets and desserts are OK to include in moderation. Seasoning and other foods All pasteurized condiments. The items listed above may not be all the foods and drinks you can have. Talk with a dietitian to learn more. What does 340 extra calories look like? Healthy snacks that give you 340 more calories a day could be: Peanut butter and jelly with milk: 8 oz (237 mL) of low-fat milk. Peanut butter and jelly sandwich made with: 1 slice of whole-wheat bread. 2 teaspoons (10 g) of peanut butter. Yogurt and berries: 1 cup (245 g) of Greek yogurt. 1 cup (150 g) of berries. 2 tablespoons (30 g) of chopped nuts, such as almonds or walnuts. Avocado toast: 1 slice of whole-wheat bread. 1/2 medium avocado (70 g). 1 large egg (50 g). What foods should I avoid? Fruits Raw (unpasteurized) fruit juices. Vegetables Unpasteurized vegetable juices. Meats and other protein foods Precooked or cured meat, such as bologna, hot dogs, sausages, or meat loaves. (If you must eat those meats, reheat them until they are steaming hot.) Refrigerated pate, meat spreads from a meat counter, or smoked seafood that's found in the refrigerated section of a store. Raw or undercooked meats, poultry, and  eggs. Raw fish, such as sushi or sashimi. Fish that have high mercury content, such as tilefish, shark, swordfish, and king mackerel. Dairy Unpasteurized or raw milk and any foods that are made from them. Some of these may be: Homemade yogurts or puddings. Soft cheeses such as: Feta. Queso blanco or fresco. Pharmacist, Hospital or [place]. Blue-veined cheeses. Some of these types of cheeses may be made with pasteurized milk. Check the label. If pasteurized milk is used, they are OK to eat during pregnancy. Deli foods Premade foods from a store or deli, like chicken salad, coleslaw, or egg salad. These are riskier for food illness than fresh or homemade salads. Beverages Alcohol. Sugar-sweetened drinks, such as sodas or teas. Energy drinks. Seasoning and other foods Homemade fermented foods and drinks, such as: Pickles. Sauerkraut. Kombucha. Store-bought pasteurized versions of these are OK. The items listed above may not be all the foods and drinks you should avoid. Talk with a dietitian to learn more. Where to find more information To learn more, go to: Centers for Disease Control and Prevention at tonerpromos.no. Click Search and type food choices for pregnancy. Find the link you  need. MyPlate at http://pittman-dennis.biz/. This information is not intended to replace advice given to you by your health care provider. Make sure you discuss any questions you have with your health care provider. Document Revised: 12/21/2022 Document Reviewed: 12/21/2022 Elsevier Patient Education  2025 [employer]. Exercise During Pregnancy Exercise is an important part of being healthy for people of all ages. Exercise helps your heart and lungs work well. Exercise also: Helps you stay strong and flexible. Helps you keep a healthy body weight. Boosts your energy levels and improves your mood. You should try to exercise regularly during pregnancy. Exercise routines may need to change later in your pregnancy. In  rare cases, certain medical problems in your pregnancy may limit the exercise you can do during pregnancy. Your health care provider will give you information on what exercises will work for you. How does exercise help during pregnancy? Along with staying strong and flexible, exercising during pregnancy can help: Keep strength in muscles that are used during labor and birth. Control weight gain. Speed up your recovery after giving birth. Reduce the need for insulin if you get diabetes during pregnancy. Decrease low back pain. Lower the risk for depression. Lower the risk of cesarean delivery. Treat trouble pooping (constipation). How does exercise affect my baby? Exercise can help you have a healthy pregnancy. Exercise does not cause your baby to be born early. It will not cause your baby to weigh less at birth. What exercises can I do? Many exercises are safe for you to do during pregnancy. Do a variety of exercises that safely increase your heart and breathing rates and help you build and maintain muscle strength. Do exercises as told by your provider. Your provider may recommend: Walking. Swimming. Water aerobics. Riding a stationary bike. Modified yoga or Pilates. Tell your instructor that you're pregnant. Avoid overstretching. Avoid lying on your back for long periods of time. Resistance exercises with weights or elastic bands. Running or jogging. Choose this type of exercise only if: You ran or jogged regularly before your pregnancy. You can run or jog and still talk in full sentences. What exercises should I avoid? You may be told to limit high-intensity exercise depending on your level of fitness and if you exercised regularly before you became pregnant. You can tell that you're exercising at a high intensity if you're breathing much harder and faster and can't hold a conversation while exercising. You may be told to: Avoid jogging or running, unless you jogged or ran regularly  before you became pregnant. Do not run or jog so fast that you're unable to have a conversation. Avoid activities that put you at risk for falling on your belly or getting hit in the belly. Some of these are: Downhill skiing. Rock climbing. Cycling and gymnastics. Horseback riding. Surfing and waterskiing. Contact sports. Avoid scuba diving. Avoid skydiving. Avoid activities that take place in a room that's heated to high temperatures, such as hot yoga or hot Pilates. How do I exercise in a safe way?  Start slowly. Ask your provider to recommend the types of exercise that are safe for you. Avoid overheating. Do not exercise in very high temperatures or hot rooms. Avoid hot yoga or hot Pilates. Avoid standing still or lying flat on your back as much as you can. Avoid losing too much fluid (dehydration). Drink more fluids as told. Drink before, during, and after you exercise. Avoid overstretching. Because of hormone changes during pregnancy, it's easy to overstretch muscles, tendons, and ligaments. Ligaments  are the tissues that connect bones to each other. Do not exercise to lose weight. Do not exercise at more than 6,000 feet above sea level (high elevation) if you don't live at that elevation. Tips and recommendations Wear loose-fitting, breathable clothes. Wear a sports bra to support your breasts. Exercise on most days or all days of the week. Try to exercise for 30 minutes a day, 5 days a week. If problems come up during your pregnancy, you provider may tell you to limit some exercises or to exercise less. If you have concerns, ask your provider. If you actively exercised before your pregnancy, your provider may tell you to continue to do moderate-intensity to high-intensity exercise. If you're just starting to exercise or didn't exercise much before your pregnancy, your provider may tell you to do low-intensity to moderate-intensity exercise. Questions to ask your health care  provider Is exercise safe for me? What are signs that I should stop exercising? Does my health condition mean that I should not exercise during pregnancy? When should I avoid exercising during pregnancy? Stop exercising and contact a health care provider if: You have any unusual symptoms, such as: Mild contractions or cramps in the belly. Dizziness that does not go away when you rest. Headache. Pain and swelling of your calves. Bleeding or fluid leaking from your vagina. Stop exercising and get help right away if: You have: Chest pain. Shortness of breath. Sudden, severe pain in your low back or your belly. Regular, painful contractions before 37 weeks of pregnancy. These symptoms may be an emergency. Call 911 right away. Do not wait to see if the symptoms will go away. Do not drive yourself to the hospital. This information is not intended to replace advice given to you by your health care provider. Make sure you discuss any questions you have with your health care provider. Document Revised: 08/29/2022 Document Reviewed: 08/29/2022 Elsevier Patient Education  2024 Else[employer] Inc. Second Trimester of Pregnancy  The second trimester of pregnancy is from week 14 through week 27. This is months 4 through 6 of pregnancy. During the second trimester: Morning sickness is less or has stopped. You may have more energy. You may feel hungry more often. At this time, your unborn baby is growing very fast. At the end of the sixth month, the unborn baby may be up to 12 inches long and weigh about 1 pounds. You will likely start to feel the baby move between 16 and 20 weeks of pregnancy. Body changes during your second trimester Your body continues to change during this time. The changes usually go away after your baby is born. Physical changes You will gain more weight. Your belly will get bigger. You may begin to get stretch marks on your hips, belly, and breasts. Your breasts will keep  growing and may hurt. You may get dark spots or blotches on your face. A dark line from your belly button to the pubic area may appear. This line is called linea nigra. Your hair may grow faster and get thicker. Health changes You may have headaches. You may have heartburn. You may pee more often. You may have swollen, bulging veins (varicose veins). You may have trouble pooping (constipation), or swollen veins in the butt that can itch or get painful (hemorrhoids). You may have back pain. This is caused by: Weight gain. Pregnancy hormones that are relaxing the joints in your pelvis. Follow these instructions at home: Medicines Talk to your health care provider if you're taking  medicines. Ask if the medicines are safe to take during pregnancy. Your provider may change the medicines that you take. Do not take any medicines unless told to by your provider. Take a prenatal vitamin that has at least 600 micrograms (mcg) of folic acid. Do not use herbal medicines, illegal drugs, or medicines that are not approved by your provider. Eating and drinking While you're pregnant your body needs extra food for your growing baby. Talk with your provider about what to eat while pregnant. Activity Most women are able to exercise during pregnancy. Exercises may need to change as your pregnancy goes on. Talk to your provider about your activities and exercise routines. Relieving pain and discomfort Wear a good, supportive bra if your breasts hurt. Rest with your legs raised if you have leg cramps or low back pain. Take warm sitz baths to soothe pain from hemorrhoids. Use hemorrhoid cream if your provider says it's okay. Do not douche. Do not use tampons or scented pads. Do not use hot tubs, steam rooms, or saunas. Safety Wear your seatbelt at all times when you're in a car. Talk to your provider if someone hits you, hurts you, or yells at you. Talk with your provider if you're feeling sad or have  thoughts of hurting yourself. Lifestyle Certain things can be harmful while you're pregnant. It's best to avoid the following: Do not drink alcohol,smoke, vape, or use products with nicotine  or tobacco in them. If you need help quitting, talk with your provider. Avoid cat litter boxes and soil used by cats. These things carry germs that can cause harm to your pregnancy and your baby. General instructions Keep all follow-up visits. It helps you and your unborn baby stay as healthy as possible. Write down your questions. Take them to your prenatal visits. Your provider will: Talk with you about your overall health. Give you advice or refer you to specialists who can help with different needs, including: Prenatal education classes. Mental health and counseling. Foods and healthy eating. Ask for help if you need help with food. Where to find more information American Pregnancy Association: americanpregnancy.org [employer]necologists: acog.org Office on [employer] Health: [place] Contact a health care provider if: You have a headache that does not go away when you take medicine. You have any of these problems: You can't eat or drink. You throw up or feel like you may throw up. You have watery poop (diarrhea) for 2 days or more. You have pain when you pee or your pee smells bad. You have been sick for 2 days or more and are not getting better. Contact your provider right away if: You have any of these coming from your vagina: Abnormal discharge. Bad-smelling fluid. Bleeding. Your baby is moving less than usual. You have contractions, belly cramping, or have pain in your pelvis or lower back. You have symptoms of high blood pressure or preeclampsia. These include: A severe, throbbing headache that does not go away. Sudden or extreme swelling of your face, hands, legs, or feet. Vision problems: You see spots. You have blurry vision. Your eyes are  sensitive to light. If you can't reach the provider, go to an urgent care or emergency room. Get help right away if: You faint, become confused, or can't think clearly. You have chest pain or trouble breathing. You have any kind of injury, such as from a fall or a car crash. These symptoms may be an emergency. Call 911 right away. Do not wait  to see if the symptoms will go away. Do not drive yourself to the hospital. This information is not intended to replace advice given to you by your health care provider. Make sure you discuss any questions you have with your health care provider. Document Revised: 10/06/2022 Document Reviewed: 05/06/2022 Elsevier Patient Education  2024 [employer].

## 2023-11-30 NOTE — Progress Notes (Deleted)
 New Obstetric Patient H&P    Chief Complaint: Desires prenatal care   History of Present Illness: Patient is a 23 y.o. G2P0101 Not Hispanic or Latino female, presents with amenorrhea and positive home pregnancy test. Patient's last menstrual period was 06/20/2023 (approximate). and based on her  LMP, her EDD is Estimated Date of Delivery: 03/26/24 and her EGA is [redacted]w[redacted]d. Cycles are {0-35:19561} {days/wks/mos/yrs:310907}, {Desc; regular/irreg:14544}, and occur approximately every : {numbers 22-35:14824} days. Her last pap smear was 1 years ago and was 05/09/22 NILM.    She had a urine pregnancy test which was positive {numbers (fuzzy):14653} {time frame:9076}  ago. Her last menstrual period was normal and lasted for  {numbers (fuzzy):14653} {time frame:9076}. Since her LMP she claims she has experienced ***. She denies vaginal bleeding. Her past medical history is {Noncontribuatory/Contributory:21644}. Her prior pregnancies are notable for {pregnancy complications:12320}  Since her LMP, she admits to the use of tobacco products  {yes/no:63} She claims she has gained   {inf wt change:14817} pounds since the start of her pregnancy.  There are cats in the home in the home  {yes/no:63} If yes {Desc; indoor/outdoor:13239} She admits close contact with children on a regular basis  {yes/no:63}  She has had chicken pox in the past {yes/no/unknown:74} She has had Tuberculosis exposures, symptoms, or previously tested positive for TB   {yes/no:63} Current or past history of domestic violence. {yes/no:63}  Genetic Screening/Teratology Counseling: (Includes patient, baby's father, or anyone in either family with:)   Flowsheet Row Video Visit from 11/27/2023 in [facility] at [facility]  Thalassemia No  Neural Tube Defect No  Down's Syndrome No  Jimmy Rain[place] No  Sickle Cell No  Hemophilia No  Muscular Dystrophy No  Cystic Fibrosis No  Huntington's Disease No  Mental Retardation No   Fragile X No  Chromosomal Disease No  History of Stillbirth No    1. Maternal metabolic disorder (DM, PKU, etc)  {yes/no:63} 2. Patient or FOB with a child with a birth defect not listed above no  3. Patient or FOB with a birth defect themselves {yes/no:63} 4. Recurrent pregnancy loss, or stillbirth  {yes/no:63}  5. Any medications since LMP other than prenatal vitamins (include vitamins, supplements, OTC meds, drugs, alcohol)  {yes/no:63} 6. Any other genetic/environmental exposure to discuss  {yes/no:63}  Infection History:   1. Lives with someone with TB or TB exposed  {yes/no:63}  2. Patient or partner has history of genital herpes  {yes/no:63} 3. Rash or viral illness since LMP  {yes/no:63} 4. History of STI (GC, CT, HPV, syphilis, HIV)  {yes/no:63} 5. History of recent travel :  {yes/no:63}  Other pertinent information:  {yes/no:63}     Review of Systems:10 point review of systems negative unless otherwise noted in HPI  Past Medical History:  Patient Active Problem List   Diagnosis Date Noted   Supervision of other normal pregnancy, antepartum 11/27/2023     Clinical Staff Provider  Office Location  [facility] Dating  03/26/2024, Date entered prior to episode creation  Language  English Anatomy US     Flu Vaccine  OFFER Genetic Screen  NIPS:   TDaP vaccine   OFFER Hgb A1C or  GTT Early : Third trimester :   Covid    LAB RESULTS   Rhogam     Blood Type     RSV  Antibody    Feeding Plan UNSURE Rubella    Contraception DEPO RPR     Circumcision UNUSRE HBsAg  Pediatrician  ELON  HIV    Support Person PARTNER AND SISTER Varicella    Prenatal Classes NA GBS  (For PCN allergy, check sensitivities)     Hep C     BTL Consent  Pap No results found for: DIAGPAP  VBAC Consent  Hgb Electro      CF      SMA             Chronic hypertension affecting pregnancy 11/27/2022   Postpartum care following vaginal delivery 11/27/2022   Indication for care in labor  and delivery, antepartum 11/26/2022   Maternal varicella, non-immune 11/17/2022   Maternal chronic hypertension, third trimester 11/09/2022    Medication: Procardia  30 mg daily Plan: - q4wk growth US  starting at 28 wks. 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 05/13/2022    Positive UDS on initial prenatal appt -endorses MJ use in March prior to finding out she was pregnant, counseled not to use in pregnancy    Abnormal glucose in pregnancy, antepartum 05/13/2022    [  ]  Needs 3 hour GTT    Elevated blood pressure affecting pregnancy, antepartum 05/09/2022    Initial BP 129/86, repeat 138/91  Chart review- previous Bps in the 140s, likely chronic HTN, recommended [facility] to [facility]- pt accetps    Supervision of high-risk pregnancy 05/06/2022     Nursing Staff Provider  Office Location  [facility] Dating    Language  English Anatomy US     Flu Vaccine  Given 05/09/22 Genetic Screen  NIPS:    AFP:      TDaP vaccine   Unsure, declin ed Hgb A1C or  GTT Early: A1C = 5.2 1 HR GTT= 138  (05/09/22) Third trimester:   COVID vaccine Declined 05/09/22    Rhogam     LAB RESULTS   Feeding Plan Breast Blood Type   A +  Contraception Depo Antibody  Negative 05/09/2022  Circumcision N/A Rubella  MMR x2       (09/17/02, 04/12/05)  Pediatrician  Undecided - list given 05/09/22 RPR   NR 04/22  Support Person [name] Carter/[facility] HBsAg   Neg 04/22  Prenatal Classes  HIV  NR 05/09/2022  @28wk - Doula referral?  Varicella  Varivax x2     (09/17/02, 04/12/05)    HCV  NR 04/22  BTL Consent  GBS  positive  ASA   Accepts 4/22    VBAC Consent  Pap      Hgb Electro  Negative 05/09/2022  BP Cuff ordered  CF   Delivery Group  [facility] - contact card given 05/09/22 SMA   Centering Group  OBCM involved         Obesity affecting pregnancy 11/01/2018    Recommendations [ x] Aspirin 81 mg daily after 12 weeks, accepts 4/22  continue until end of pregnancy [ ]  Nutrition consult [ ]  if BMI gets > 45, will need anesthesia consult at 34-36 weeks [x ] Weight gain 11-20 lbs for singleton and 25-35 lbs for twin pregnancy (IOM guidelines) Higher class of obesity patients recommended to gain closer to lower limit  Weight loss is associated with adverse outcomes [x ] Baseline and surveillance labs (pulled in from [facility], refresh links as needed)  Lab Results  Component Value Date   PLT 235 04/06/2021   CREATININE 0.80 04/06/2021   AST 14 (L) 04/06/2021  ALT 15 04/06/2021    Antenatal Testing:  [ x] Growth scans every 4-6 weeks at 28 weeks (fundal height likely inadequate in morbidly obese patients) [ ]  @ [facility] start NST 2x weekly at 36, NST/ AFI weekly after 36 weeks  Postpartum Care: [ ]  Consider prophylactic wound vac/PICO for C/S [ ]  Lovenox for DVT/PE prophylaxis (6 hours after vaginal delivery, 12 hours after C/S).   Lovenox 40 mg [place] q24h (BMI 30.0-39.9 kg/m2)  Lovenox 0.5 mg/kg [place] q12h ((BMI >=40 kg/m2 ); Max 150 mg [place] q12h.  Consider prolonged therapy x 6 weeks PP in very concerning patients (I.e morbid obesity with other co-morbidities that increase risk of DVT/PE) [ ]  Counsel about diet, exercise and weight loss. Referrals PRN.  ICD10 Codes: O99.210   Obesity in pregnancy (BMI 30.0-39.9 kg/m2)  O99.210, E66.01 Maternal Morbid Obesity (BMI >=40 kg/m2 ).**Have to use both codes, this is a HCC code and risk adjusts/more reimbursement**       Past Surgical History:  Past Surgical History:  Procedure Laterality Date   wisdom tooth exttraction     x1, local anesthesia    Gynecologic History: Patient's last menstrual period was 06/20/2023 (approximate).  Obstetric History: G2P0101  Family History:  Family History  Problem Relation Age of Onset   Diabetes Mother    Hypertension Mother    Diabetes Father    Healthy Sister    Asthma Brother    Healthy Maternal Grandmother    Healthy Maternal  Grandfather    Diabetes Paternal Grandmother    Healthy Paternal Grandfather    Healthy Half-Brother    Healthy Half-Brother    Healthy Half-Brother    Healthy Half-Sister    Healthy Half-Sister    Healthy Half-Sister     Social History:  Social History   Socioeconomic History   Marital status: Significant Other    Spouse name: [name] Kallie   Number of children: 0   Years of education: 12   Highest education level: High school graduate  Occupational History   Occupation: Unemployed  Tobacco Use   Smoking status: Former    Types: Cigars    Quit date: 04/16/2022    Years since quitting: 1.6    Passive exposure: Past   Smokeless tobacco: Never   Tobacco comments:    No smoking since found out pregnant; would smoke 2 Black and Mild cigars every couple of days.    Given [place] QuitLine card.  Vaping Use   Vaping status: Never Used  Substance and Sexual Activity   Alcohol use: Not Currently    Comment: Last ETOH 04/09/22.   Drug use: Not Currently    Comment: Last MJ use 04/16/22; none since found out pregnant.   Sexual activity: Yes    Partners: Male    Birth control/protection: None    Comment: Reports last Depo in 2019 with intermittent condom use since. No condom use in 2024.  Other Topics Concern   Not on file  Social History Narrative   Lives alone.   FOB is a Customer Service Manager.   Social Drivers of Corporate Investment Banker Strain: Low Risk  (08/01/2022)   Overall Financial Resource Strain (CARDIA)    Difficulty of Paying Living Expenses: Not very hard  Food Insecurity: No Food Insecurity (11/27/2023)   Hunger Vital Sign    Worried About Running Out of Food in the Last Year: Never true    Ran Out of Food in the Last Year: Never true  Transportation Needs: No Transportation Needs (11/27/2023)   PRAPARE - Administrator, Civil Service (Medical): No    Lack of Transportation (Non-Medical): No  Physical Activity: Sufficiently Active (11/27/2023)    Exercise Vital Sign    Days of Exercise per Week: 5 days    Minutes of Exercise per Session: 50 min  Stress: No Stress Concern Present (08/01/2022)   [employer] of Occupational Health - Occupational Stress Questionnaire    Feeling of Stress : Not at all  Social Connections: Moderately Isolated (11/27/2023)   Social Connection and Isolation Panel    Frequency of Communication with Friends and Family: More than three times a week    Frequency of Social Gatherings with Friends and Family: More than three times a week    Attends Religious Services: Never    Database Administrator or Organizations: No    Attends Banker Meetings: Never    Marital Status: Living with partner  Intimate Partner Violence: Not At Risk (11/27/2023)   Humiliation, Afraid, Rape, and Kick questionnaire    Fear of Current or Ex-Partner: No    Emotionally Abused: No    Physically Abused: No    Sexually Abused: No    Allergies:  No Known Allergies  Medications: Prior to Admission medications   Medication Sig Start Date End Date Taking? Authorizing Provider  NIFEdipine  (ADALAT  CC) 60 MG 24 hr tablet Take 1 tablet (60 mg total) by mouth daily. Patient not taking: Reported on 11/27/2023 11/29/22   Jayne Harlene CROME, CNM    Physical Exam Vitals: LMP 06/20/2023 (Approximate)   General: NAD HEENT: normocephalic, anicteric Thyroid: no enlargement, no palpable nodules Pulmonary: No increased work of breathing, CTAB Cardiovascular: RRR, distal pulses 2+ Abdomen: NABS, soft, non-tender, non-distended.  Umbilicus without lesions.  No hepatomegaly, splenomegaly or masses palpable. No evidence of hernia  Genitourinary:  External: Normal external female genitalia.  Normal urethral meatus, normal  Bartholin's and Skene's glands.    Vagina: Normal vaginal mucosa, no evidence of prolapse.    Cervix: Grossly normal in appearance, no bleeding  Uterus: *** Non-enlarged, mobile, normal contour.  No  CMT  Adnexa: ovaries non-enlarged, no adnexal masses  Rectal: deferred Extremities: no edema, erythema, or tenderness Neurologic: Grossly intact Psychiatric: mood appropriate, affect full   Assessment: 23 y.o. G2P0101 at [redacted]w[redacted]d presenting to initiate prenatal care  Plan: 1) Avoid alcoholic beverages. 2) Patient encouraged not to smoke.  3) Discontinue the use of all non-medicinal drugs and chemicals.  4) Take prenatal vitamins daily.  5) Nutrition, food safety (fish, cheese advisories, and high nitrite foods) and exercise discussed. 6) Hospital and practice style discussed with cross coverage system.  7) Genetic Screening, such as with 1st Trimester Screening, cell free fetal DNA, AFP testing, and Ultrasound, as well as with amniocentesis and CVS as appropriate, is discussed with patient. At the conclusion of today's visit patient {Desc; requested/declined/undecided:14580} genetic testing 8) Patient is asked about travel to areas at risk for the Zika virus, and counseled to avoid travel and exposure to mosquitoes or sexual partners who may have themselves been exposed to the virus. Testing is discussed, and will be ordered as appropriate.   [place], LPN 88/86/7974 89:57 AM

## 2023-12-04 ENCOUNTER — Encounter: Admitting: Advanced Practice Midwife

## 2023-12-04 DIAGNOSIS — Z3480 Encounter for supervision of other normal pregnancy, unspecified trimester: Secondary | ICD-10-CM

## 2024-01-31 ENCOUNTER — Other Ambulatory Visit: Payer: Self-pay

## 2024-01-31 ENCOUNTER — Ambulatory Visit: Admitting: Obstetrics

## 2024-01-31 ENCOUNTER — Other Ambulatory Visit (HOSPITAL_COMMUNITY)
Admission: RE | Admit: 2024-01-31 | Discharge: 2024-01-31 | Disposition: A | Discharge status: Home / Self Care | Source: Ambulatory Visit | Attending: Obstetrics | Admitting: Obstetrics

## 2024-01-31 VITALS — BP 133/85 | HR 101 | Ht 67.0 in | Wt 281.0 lb

## 2024-01-31 DIAGNOSIS — Z6841 Body Mass Index (BMI) 40.0 and over, adult: Secondary | ICD-10-CM

## 2024-01-31 DIAGNOSIS — O0933 Supervision of pregnancy with insufficient antenatal care, third trimester: Secondary | ICD-10-CM | POA: Diagnosis not present

## 2024-01-31 DIAGNOSIS — Z1379 Encounter for other screening for genetic and chromosomal anomalies: Secondary | ICD-10-CM

## 2024-01-31 DIAGNOSIS — Z3A32 32 weeks gestation of pregnancy: Secondary | ICD-10-CM | POA: Diagnosis not present

## 2024-01-31 DIAGNOSIS — Z113 Encounter for screening for infections with a predominantly sexual mode of transmission: Secondary | ICD-10-CM | POA: Insufficient documentation

## 2024-01-31 DIAGNOSIS — Z3480 Encounter for supervision of other normal pregnancy, unspecified trimester: Secondary | ICD-10-CM

## 2024-01-31 DIAGNOSIS — O09293 Supervision of pregnancy with other poor reproductive or obstetric history, third trimester: Secondary | ICD-10-CM | POA: Diagnosis not present

## 2024-01-31 DIAGNOSIS — Z3689 Encounter for other specified antenatal screening: Secondary | ICD-10-CM

## 2024-01-31 DIAGNOSIS — O09299 Supervision of pregnancy with other poor reproductive or obstetric history, unspecified trimester: Secondary | ICD-10-CM

## 2024-01-31 DIAGNOSIS — Z131 Encounter for screening for diabetes mellitus: Secondary | ICD-10-CM

## 2024-01-31 NOTE — Progress Notes (Signed)
 NEW OB HISTORY AND PHYSICAL  SUBJECTIVE:       Tracy Hopkins is a 24 y.o. G68P0101 female, Patient's last menstrual period was 06/20/2023 (approximate)., Estimated Date of Delivery: 03/26/24, [redacted]w[redacted]d, presents today for establishment of Prenatal Care. She reports some cramping but no unusual complaints. Her last pregnancy was complicated by elevated BP and she was induced at 36.6w. This pregnancy was a surprise; she has had a hard time finding childcare to get to prenatal visits. She was prescribed Procardia  in her last pregnancy,  but has not taken it since her child was born.  Social history Partner/Relationship: boyfriend Living situation: lives with 35-year-old, partner, and his family Work: [employer] Exercise: walking Substance use: past MJ use   Gynecologic History Patient's last menstrual period was 06/20/2023 (approximate).  Contraception: none Last Pap: 05/09/2022. Results were: normal  Obstetric History OB History  Gravida Para Term Preterm AB Living  2 1  1  0 1  SAB IAB Ectopic Multiple Live Births     0 1    # Outcome Date GA Lbr Len/2nd Weight Sex Type Anes PTL Lv  2 Current           1 Preterm 11/27/22 [redacted]w[redacted]d / 00:21 5 lb 9.6 oz (2.54 kg) F Vag-Spont EPI  LIV    Past Medical History:  Diagnosis Date   Chronic hypertension 09/15/2022   Elevated BP without diagnosis of hypertension    Maternal chronic hypertension, third trimester 11/09/2022   Patient denies medical problems     Past Surgical History:  Procedure Laterality Date   wisdom tooth exttraction     x1, local anesthesia    Medications Ordered Prior to Encounter[1]  Allergies[2]  Social History   Socioeconomic History   Marital status: Significant Other    Spouse name: [name] Kallie   Number of children: 0   Years of education: 12   Highest education level: High school graduate  Occupational History   Occupation: Unemployed  Tobacco Use   Smoking status: Former    Types: Cigars    Quit date:  04/16/2022    Years since quitting: 1.7    Passive exposure: Past   Smokeless tobacco: Never   Tobacco comments:    No smoking since found out pregnant; would smoke 2 Black and Mild cigars every couple of days.    Given [place] QuitLine card.  Vaping Use   Vaping status: Never Used  Substance and Sexual Activity   Alcohol use: Not Currently    Comment: Last ETOH 04/09/22.   Drug use: Not Currently    Comment: Last MJ use 04/16/22; none since found out pregnant.   Sexual activity: Yes    Partners: Male    Birth control/protection: None    Comment: Reports last Depo in 2019 with intermittent condom use since. No condom use in 2024.  Other Topics Concern   Not on file  Social History Narrative   Lives alone.   FOB is a Customer Service Manager.   Social Drivers of Health   Tobacco Use: Medium Risk (11/27/2023)   Patient History    Smoking Tobacco Use: Former    Smokeless Tobacco Use: Never    Passive Exposure: Past  Physicist, Medical Strain: Low Risk (08/01/2022)   Overall Financial Resource Strain (CARDIA)    Difficulty of Paying Living Expenses: Not very hard  Food Insecurity: No Food Insecurity (11/27/2023)   Epic    Worried About Radiation Protection Practitioner of Food in the Last Year: Never  true    Ran Out of Food in the Last Year: Never true  Transportation Needs: No Transportation Needs (11/27/2023)   Epic    Lack of Transportation (Medical): No    Lack of Transportation (Non-Medical): No  Physical Activity: Sufficiently Active (11/27/2023)   Exercise Vital Sign    Days of Exercise per Week: 5 days    Minutes of Exercise per Session: 50 min  Stress: No Stress Concern Present (08/01/2022)   [employer] of Occupational Health - Occupational Stress Questionnaire    Feeling of Stress : Not at all  Social Connections: Moderately Isolated (11/27/2023)   Social Connection and Isolation Panel    Frequency of Communication with Friends and Family: More than three times a week    Frequency of  Social Gatherings with Friends and Family: More than three times a week    Attends Religious Services: Never    Database Administrator or Organizations: No    Attends Banker Meetings: Never    Marital Status: Living with partner  Intimate Partner Violence: Not At Risk (11/27/2023)   Epic    Fear of Current or Ex-Partner: No    Emotionally Abused: No    Physically Abused: No    Sexually Abused: No  Depression (PHQ2-9): Low Risk (05/09/2022)   Depression (PHQ2-9)    PHQ-2 Score: 4  Alcohol Screen: Low Risk (11/27/2023)   Alcohol Screen    Last Alcohol Screening Score (AUDIT): 0  Housing: Low Risk (11/27/2023)   Epic    Unable to Pay for Housing in the Last Year: No    Number of Times Moved in the Last Year: 0    Homeless in the Last Year: No  Utilities: Not At Risk (11/27/2023)   Epic    Threatened with loss of utilities: No  Health Literacy: Adequate Health Literacy (11/27/2023)   B1300 Health Literacy    Frequency of need for help with medical instructions: Never    Family History  Problem Relation Age of Onset   Diabetes Mother    Hypertension Mother    Diabetes Father    Healthy Sister    Asthma Brother    Healthy Maternal Grandmother    Healthy Maternal Grandfather    Diabetes Paternal Grandmother    Healthy Paternal Grandfather    Healthy Half-Brother    Healthy Half-Brother    Healthy Half-Brother    Healthy Half-Sister    Healthy Half-Sister    Healthy Half-Sister     The following portions of the patient's history were reviewed and updated as appropriate: allergies, current medications, past OB history, past medical history, past surgical history, past family history, past social history, and problem list.  Constitutional: Denied constitutional symptoms, night sweats, recent illness, fatigue, fever, insomnia and weight loss.  Eyes: Denied eye symptoms, eye pain, photophobia, vision change and visual disturbance.  Ears/Nose/Throat/Neck: Denied  ear, nose, throat or neck symptoms, hearing loss, nasal discharge, sinus congestion and sore throat.  Cardiovascular: Denied cardiovascular symptoms, arrhythmia, chest pain/pressure, edema, exercise intolerance, orthopnea and palpitations.  Respiratory: Denied pulmonary symptoms, asthma, pleuritic pain, productive sputum, cough, dyspnea and wheezing.  Gastrointestinal: Denied gastro-esophageal reflux, melena, nausea and vomiting.  Genitourinary: Denied genitourinary symptoms including symptomatic vaginal discharge, pelvic relaxation issues, and urinary complaints.  Musculoskeletal: Denied musculoskeletal symptoms, stiffness, swelling, muscle weakness and myalgia.  Dermatologic: Denied dermatology symptoms, rash and scar.  Neurologic: Denied neurology symptoms, dizziness, headache, neck pain and syncope.  Psychiatric: Denied psychiatric symptoms, anxiety and  depression.  Endocrine: Denied endocrine symptoms including hot flashes and night sweats.    Indications for ASA therapy (per uptodate) One of the following: Previous pregnancy with preeclampsia, especially early onset and with an adverse outcome No Multifetal gestation No Chronic hypertension Yes Type 1 or 2 diabetes mellitus No Chronic kidney disease No Autoimmune disease (antiphospholipid syndrome, systemic lupus erythematosus) No  Two or more of the following: Nulliparity No Obesity (body mass index >30 kg/m2) Yes Family history of preeclampsia in mother or sister No Age >=35 years No Sociodemographic characteristics (African American race, low socioeconomic level) Yes Personal risk factors (eg, previous pregnancy with low birth weight or small for gestational age infant, previous adverse pregnancy outcome [eg, stillbirth], interval >10 years between pregnancies) Yes  *[place] is outside of the recommended GA window for initiating LDASA  OBJECTIVE: Initial Physical Exam (New OB)  GENERAL APPEARANCE: alert, well  appearing HEAD: normocephalic, atraumatic MOUTH: mucous membranes moist, pharynx normal without lesions THYROID: no thyromegaly or masses present BREASTS: no masses noted, no significant tenderness, no palpable axillary nodes, no skin changes LUNGS: clear to auscultation, no wheezes, rales or rhonchi, symmetric air entry HEART: regular rate and rhythm, no murmurs ABDOMEN: soft, nontender, nondistended, no abnormal masses, no epigastric pain, fundus soft, nontender 32 cm, and FHT present (155 bpm) EXTREMITIES: no redness or tenderness in the calves or thighs SKIN: normal coloration and turgor, no rashes LYMPH NODES: no adenopathy palpable NEUROLOGIC: alert, oriented, normal speech, no focal findings or movement disorder noted  PELVIC EXAM declined  ASSESSMENT: Normal pregnancy Late to care H/o CHTN   PLAN: Routine prenatal care. We discussed an overview of prenatal care and when to call. Reviewed diet, exercise, and weight gain recommendations in pregnancy. She does not plan to BF. I answered all questions. Labs including baseline preE labs and genetic screening today. -Will schedule 1-hr glucose ASAP -Needs serial growth scans and NSTs at 34w for pregravid BMI>40. BPP/growth ordered for next visit.  See orders  Eleanor Canny, CNM     [1]  Current Outpatient Medications on File Prior to Visit  Medication Sig Dispense Refill   NIFEdipine  (ADALAT  CC) 60 MG 24 hr tablet Take 1 tablet (60 mg total) by mouth daily. 30 tablet 1   No current facility-administered medications on file prior to visit.  [2] No Known Allergies

## 2024-02-01 LAB — CBC/D/PLT+RPR+RH+ABO+RUBIGG...
Antibody Screen: NEGATIVE
Basophils Absolute: 0 x10E3/uL (ref 0.0–0.2)
Basos: 0 %
EOS (ABSOLUTE): 0.1 x10E3/uL (ref 0.0–0.4)
Eos: 1 %
HCV Ab: NONREACTIVE
HIV Screen 4th Generation wRfx: NONREACTIVE
Hematocrit: 34.2 % (ref 34.0–46.6)
Hemoglobin: 11.1 g/dL (ref 11.1–15.9)
Hepatitis B Surface Ag: NEGATIVE
Immature Grans (Abs): 0.1 x10E3/uL (ref 0.0–0.1)
Immature Granulocytes: 1 %
Lymphocytes Absolute: 1.5 x10E3/uL (ref 0.7–3.1)
Lymphs: 16 %
MCH: 29.1 pg (ref 26.6–33.0)
MCHC: 32.5 g/dL (ref 31.5–35.7)
MCV: 90 fL (ref 79–97)
Monocytes Absolute: 0.6 x10E3/uL (ref 0.1–0.9)
Monocytes: 6 %
Neutrophils Absolute: 7.2 x10E3/uL — ABNORMAL HIGH (ref 1.4–7.0)
Neutrophils: 76 %
Platelets: 249 x10E3/uL (ref 150–450)
RBC: 3.81 x10E6/uL (ref 3.77–5.28)
RDW: 13.4 % (ref 11.7–15.4)
RPR Ser Ql: NONREACTIVE
Rh Factor: POSITIVE
Rubella Antibodies, IGG: 5.48 {index}
Varicella zoster IgG: NONREACTIVE
WBC: 9.5 x10E3/uL (ref 3.4–10.8)

## 2024-02-01 LAB — URINALYSIS, ROUTINE W REFLEX MICROSCOPIC
Glucose, UA: NEGATIVE
Nitrite, UA: NEGATIVE
RBC, UA: NEGATIVE
Specific Gravity, UA: 1.023 (ref 1.005–1.030)
Urobilinogen, Ur: 1 mg/dL (ref 0.2–1.0)
pH, UA: 6 (ref 5.0–7.5)

## 2024-02-01 LAB — PROTEIN / CREATININE RATIO, URINE
Creatinine, Urine: 335.1 mg/dL
Protein, Ur: 40.5 mg/dL
Protein/Creat Ratio: 121 mg/g{creat} (ref 0–200)

## 2024-02-01 LAB — MICROSCOPIC EXAMINATION
Casts: NONE SEEN /LPF
Epithelial Cells (non renal): >10 /HPF — AB (ref 0–10)
RBC, Urine: NONE SEEN /HPF (ref 0–2)

## 2024-02-01 LAB — TSH: TSH: 1.09 u[IU]/mL (ref 0.450–4.500)

## 2024-02-01 LAB — HCV INTERPRETATION

## 2024-02-01 LAB — HEMOGLOBIN A1C
Est. average glucose Bld gHb Est-mCnc: 103 mg/dL
Hgb A1c MFr Bld: 5.2 % (ref 4.8–5.6)

## 2024-02-02 ENCOUNTER — Ambulatory Visit: Payer: Self-pay | Admitting: Obstetrics

## 2024-02-02 LAB — CERVICOVAGINAL ANCILLARY ONLY
Chlamydia: NEGATIVE
Comment: NEGATIVE
Comment: NORMAL
Neisseria Gonorrhea: NEGATIVE

## 2024-02-02 LAB — CULTURE, OB URINE

## 2024-02-02 LAB — URINE CULTURE, OB REFLEX

## 2024-02-04 LAB — MATERNIT 21 PLUS CORE, BLOOD
Fetal Fraction: 33
Result (T21): NEGATIVE
Trisomy 13 (Patau syndrome): NEGATIVE
Trisomy 18 (Edwards syndrome): NEGATIVE
Trisomy 21 (Down syndrome): NEGATIVE

## 2024-02-05 ENCOUNTER — Telehealth: Payer: Self-pay

## 2024-02-05 NOTE — Telephone Encounter (Signed)
 Attempted to call patient. No answer. LVM to return call to clinic.

## 2024-02-05 NOTE — Addendum Note (Signed)
 Addended by: JAKIE RAISIN D on: 02/05/2024 08:19 AM   Modules accepted: Orders

## 2024-02-07 ENCOUNTER — Telehealth: Payer: Self-pay | Admitting: Certified Nurse Midwife

## 2024-02-07 ENCOUNTER — Other Ambulatory Visit

## 2024-02-07 NOTE — Telephone Encounter (Signed)
 Reached out to pt to reschedule 1 hr glucose lab that was scheduled on 02/07/2024 at 10:00.  Left message for pt to call back to reschedule.

## 2024-02-08 ENCOUNTER — Encounter: Payer: Self-pay | Admitting: Obstetrics

## 2024-02-08 NOTE — Telephone Encounter (Signed)
 Reached out to pt (2x) to reschedule 1 hr glucose lab that was scheduled on 02/07/2024 at 10:00.  Left message for pt to call back to reschedule.  Will send a MyChart letter to pt.

## 2024-02-13 ENCOUNTER — Ambulatory Visit

## 2024-02-13 ENCOUNTER — Other Ambulatory Visit

## 2024-02-13 ENCOUNTER — Ambulatory Visit: Admitting: Registered Nurse

## 2024-02-13 VITALS — BP 140/82 | HR 85 | Wt 285.5 lb

## 2024-02-13 DIAGNOSIS — Z3A34 34 weeks gestation of pregnancy: Secondary | ICD-10-CM

## 2024-02-13 DIAGNOSIS — Z3A33 33 weeks gestation of pregnancy: Secondary | ICD-10-CM

## 2024-02-13 DIAGNOSIS — O10913 Unspecified pre-existing hypertension complicating pregnancy, third trimester: Secondary | ICD-10-CM

## 2024-02-13 DIAGNOSIS — E669 Obesity, unspecified: Secondary | ICD-10-CM

## 2024-02-13 DIAGNOSIS — O09293 Supervision of pregnancy with other poor reproductive or obstetric history, third trimester: Secondary | ICD-10-CM

## 2024-02-13 DIAGNOSIS — O0993 Supervision of high risk pregnancy, unspecified, third trimester: Secondary | ICD-10-CM

## 2024-02-13 DIAGNOSIS — Z3689 Encounter for other specified antenatal screening: Secondary | ICD-10-CM

## 2024-02-13 DIAGNOSIS — O99213 Obesity complicating pregnancy, third trimester: Secondary | ICD-10-CM | POA: Diagnosis not present

## 2024-02-13 DIAGNOSIS — O10919 Unspecified pre-existing hypertension complicating pregnancy, unspecified trimester: Secondary | ICD-10-CM

## 2024-02-13 DIAGNOSIS — O10013 Pre-existing essential hypertension complicating pregnancy, third trimester: Secondary | ICD-10-CM

## 2024-02-13 DIAGNOSIS — O09299 Supervision of pregnancy with other poor reproductive or obstetric history, unspecified trimester: Secondary | ICD-10-CM

## 2024-02-13 DIAGNOSIS — Z6841 Body Mass Index (BMI) 40.0 and over, adult: Secondary | ICD-10-CM

## 2024-02-13 MED ORDER — PRENATAL 27-0.8 MG PO TABS: 1.0000 | ORAL_TABLET | Freq: Every day | ORAL | 5 refills | Status: AC

## 2024-02-13 MED ORDER — NIFEDIPINE ER OSMOTIC RELEASE 30 MG PO TB24: 30.0000 mg | ORAL_TABLET | Freq: Every day | ORAL | 2 refills | Status: DC

## 2024-02-13 NOTE — Assessment & Plan Note (Signed)
 Third Trimester  [ ] Glucose screening- today [ ] Monthly growth US  starting at 24 weeks then q 4w- growth today [ ] Weekly antenatal testing 34+0- BPP today   Delivery timing  [ ] Delivery 39-40.6 weeks  [ ] SCDs with cesarean  [ ] 3-gram Cefazolin with cesarean delivery    Postpartum  [ ] Consider lovenox x 6 weeks for BMI greater than 50

## 2024-02-13 NOTE — Assessment & Plan Note (Addendum)
 Group II: CHTN on meds, no prex, AGA, nml AFV Medication: Procardia  30mg  XL prescribed today. Has only been intermittently taking the Procardia  *60mg * XL that she was prescribed after her last baby.  [x] Baseline pre-eclampsia labs at [facility] (32 wks) [  ]ASA starting at 12 weeks- too late [x] Growth q 4wks @ 24wks- normal on 1/27 [x] Antenatal testing weekly @ 32w- scheduled weekly for NST. Normal NST and AFI today. [  ]Delivery 37-39.0w

## 2024-02-13 NOTE — Patient Instructions (Signed)
 Nonstress Test: What to Expect A nonstress test, also called an NST, is done during pregnancy to check your baby's heartbeat. The procedure can help to show if your baby is healthy. It may be done if: Your due date has passed. Your pregnancy is high risk. Your baby is moving less than normal. You've lost a previous pregnancy. Your baby is growing slowly. There's too much or too little fluid around your baby. The NST may be done in the third trimester to find out if it's best for your baby to be born early. During an NST, your baby's heartbeat is watched for at least 20 minutes. If the baby is healthy, the heart rate will go up when the baby moves and will return to normal when the baby rests. This should happen at least twice during the test. Tell a health care provider about: Any allergies you have. Any medical problems you have. All medicines you take. These include vitamins, herbs, eye drops, and creams. Any surgeries you've had. Any past pregnancies you've had. What are the risks? There are no risks to you or your baby from a nonstress test. This procedure shouldn't be painful or uncomfortable. What happens before? Eat a meal right before the test or as told by your health care team. Food may help the baby to move. Use the restroom right before the test. What happens during a nonstress test?  Two monitors will be placed on your belly. One will check your baby's heart rate, and the other will check for contractions. You may be asked to lie down on your side or to sit up. You may be given a button to press when you feel your baby move. If your baby seems to be sleeping, you may be asked to drink some juice or soda, eat a snack, or change positions. These steps may vary. Ask what you can expect. What can I expect after? Your team will talk with you about the results and tell you the next steps. If your team gave you any diet or activity instructions, make sure to follow them. Keep all  follow-up visits. This is important to check on your health and the health of your baby. This information is not intended to replace advice given to you by your health care provider. Make sure you discuss any questions you have with your health care provider. Document Revised: 12/29/2022 Document Reviewed: 12/29/2022 Elsevier Patient Education  2025 [employer].
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
## 2024-02-21 NOTE — Patient Instructions (Incomplete)
 Group B Streptococcus Test During Pregnancy Why am I having this test? Routine testing, also called screening, for group B streptococcus (GBS) is recommended for all pregnant women between the 36th and 37th week of pregnancy. GBS is a type of bacteria that can be passed from mother to baby during childbirth. Screening will help guide whether or not you will need treatment during labor and delivery to prevent complications such as: An infection in your uterus during labor. An infection in your uterus after delivery. A serious infection in your baby after delivery, such as pneumonia, meningitis, or sepsis. GBS screening is not often done before 36 weeks of pregnancy unless you go into labor prematurely. What happens if I have group B streptococcus? If testing shows that you have GBS, your health care provider will recommend treatment with IV antibiotics during labor and delivery. This treatment significantly decreases the risk of complications for you and your baby. If you have a planned C-section and you have GBS, you may not need to be treated with antibiotics because GBS is usually passed to babies after labor starts and your water breaks. If you are in labor or your water breaks before your C-section, it is possible for GBS to get into your uterus and be passed to your baby, so you might need treatment. Is there a chance I may not need to be tested? You may not need to be tested for GBS if: You have a urine test that shows GBS before 36 to 37 weeks. You had a baby with GBS infection after a previous delivery. In these cases, you will automatically be treated for GBS during labor and delivery. What is being tested? This test is done to check if you have group B streptococcus in your vagina or rectum. What kind of sample is taken? To collect samples for this test, your health care provider will swab your vagina and rectum with a cotton swab. The sample is then sent to the lab to see if GBS is  present. What happens during the test?  You will remove your clothing from the waist down. You will lie down on an exam table in the same position as you would for a pelvic exam. Your health care provider will swab your vagina and rectum to collect samples for a culture test. You will be able to go home after the test and do all your usual activities. How are the results reported? The test results are reported as positive or negative. What do the results mean? A positive test means you are at risk for passing GBS to your baby during labor and delivery. Your health care provider will recommend that you are treated with an IV antibiotic during labor and delivery. A negative test means you are at very low risk of passing GBS to your baby. There is still a low risk of passing GBS to your baby because sometimes test results may report that you do not have a condition when you do (false-negative result) or there is a chance that you may become infected with GBS after the test is done. You most likely will not need to be treated with an antibiotic during labor and delivery. Talk with your health care provider about what your results mean. Questions to ask your health care provider Ask your health care provider, or the department that is doing the test: When will my results be ready? How will I get my results? What are my treatment options? Summary Routine testing (screening) for  group B streptococcus (GBS) is recommended for all pregnant women between the 36th and 37th week of pregnancy. GBS is a type of bacteria that can be passed from mother to baby during childbirth. If testing shows that you have GBS, your health care provider will recommend that you are treated with IV antibiotics during labor and delivery. This treatment almost always prevents infection in newborns. This information is not intended to replace advice given to you by your health care provider. Make sure you discuss any questions  you have with your health care provider. Document Revised: 12/20/2021 Document Reviewed: 12/20/2021 Elsevier Patient Education  2024 Else[employer] Inc[employer]Third Trimester of Pregnancy: What to Know  The third trimester of pregnancy is from week 28 through week 40. This is months 7 through 9. The third trimester is a time when your baby is growing fast. Body changes during your third trimester Your body continues to change during this time. The changes usually go away after your baby is born. Physical changes You will continue to gain weight. You may get stretch marks on your hips, belly, and breasts. Your breasts will keep growing and may hurt. A yellow fluid (colostrum) may leak from your breasts. This is the first milk you're making for your baby. Your hair may grow faster and get thicker. In some cases, you may get hair loss. Your belly button may stick out. You may have more swelling in your hands, face, or ankles. Health changes You may have heartburn. You may feel short of breath. This is caused by the uterus that is now bigger. You may have more aches in the pelvis, back, or thighs. You may have more tingling or numbness in your hands, arms, and legs. You may pee more often. You may have trouble pooping (constipation) or swollen veins in the butt that can itch or get painful (hemorrhoids). Other changes You may have more problems sleeping. You may notice the baby moving lower in your belly (dropping). You may have more fluid coming from your vagina. Your joints may feel loose, and you may have pain around your pelvic bone. Follow these instructions at home: Medicines Take medicines only as told by your health care provider. Some medicines are not safe during pregnancy. Your provider may change the medicines that you take. Do not take any medicines unless told to by your provider. Take a prenatal vitamin that has at least 600 micrograms (mcg) of folic acid. Do not use herbal medicines,  illegal drugs, or medicines that are not approved by your provider. Eating and drinking While you're pregnant your body needs additional nutrition to help support your growing baby. Talk with your provider about your nutritional needs. Activity Most women are able to exercise regularly during pregnancy. Exercise routines may need to change at the end of your pregnancy. Talk to your provider about your activities and exercise routine. Relieving pain and discomfort Rest often with your legs raised if you have leg cramps or low back pain. Take warm sitz baths to soothe pain from hemorrhoids. Use hemorrhoid cream if your provider says it's okay. Wear a good, supportive bra if your breasts hurt. Do not use hot tubs, steam rooms, or saunas. Do not douche. Do not use tampons or scented pads. Safety Talk to your provider before traveling far distances. Wear your seatbelt at all times when you're in a car. Talk to your provider if someone hits you, hurts you, or yells at you. Preparing for birth To prepare for your baby: Take  childbirth and breastfeeding classes. Visit the hospital and tour the maternity area. Buy a rear-facing car seat. Learn how to install it in your car. General instructions Avoid cat litter boxes and soil used by cats. These things carry germs that can cause harm to your pregnancy and your baby. Do not drink alcohol, smoke, vape, or use products with nicotine  or tobacco in them. If you need help quitting, talk with your provider. Keep all follow-up visits for your third trimester. Your provider will do more exams and tests during this trimester. Write down your questions. Take them to your prenatal visits. Your provider also will: Talk with you about your overall health. Give you advice or refer you to specialists who can help with different needs, including: Mental health and counseling. Foods and healthy eating. Ask for help if you need help with food. Where to find more  information American Pregnancy Association: americanpregnancy.org [employer]: acog.org Office on [employer] Health: [place] Contact a health care provider if: You have a headache that does not go away when you take medicine. You have any of these problems: You can't eat or drink. You have nausea and vomiting. You have watery poop (diarrhea) for 2 days or more. You have pain when you pee, or your pee smells bad. You have been sick for 2 days or more and aren't getting better. Contact your provider right away if: You have any of these coming from your vagina: Abnormal discharge. Bad-smelling fluid. Bleeding. Your baby is moving less than usual. You have signs of labor: You have any contractions, belly cramping, or have pain in your pelvis or lower back before 37 weeks of pregnancy (preterm labor). You have regular contractions that are less than 5 minutes apart. Your water breaks. You have symptoms of high blood pressure or preeclampsia. These include: A severe, throbbing headache that does not go away. Sudden or extreme swelling of your face, hands, legs, or feet. Vision problems: You see spots. You have blurry vision. Your eyes are sensitive to light. If you can't reach your provider, go to an urgent care or emergency room. Get help right away if: You faint, become confused, or can't think clearly. You have chest pain or trouble breathing. You have any kind of injury, such as from a fall or a car crash. These symptoms may be an emergency. Call 911 right away. Do not wait to see if the symptoms will go away. Do not drive yourself to the hospital. This information is not intended to replace advice given to you by your health care provider. Make sure you discuss any questions you have with your health care provider. Document Revised: 11/16/2023 Document Reviewed: 05/06/2022 Elsevier Patient Education  2025 [employer].

## 2024-02-22 ENCOUNTER — Ambulatory Visit: Admitting: Certified Nurse Midwife

## 2024-02-22 ENCOUNTER — Other Ambulatory Visit

## 2024-02-22 VITALS — BP 167/93 | HR 101 | Wt 281.5 lb

## 2024-02-22 DIAGNOSIS — Z6841 Body Mass Index (BMI) 40.0 and over, adult: Secondary | ICD-10-CM

## 2024-02-22 DIAGNOSIS — O10919 Unspecified pre-existing hypertension complicating pregnancy, unspecified trimester: Secondary | ICD-10-CM

## 2024-02-22 DIAGNOSIS — Z3A35 35 weeks gestation of pregnancy: Secondary | ICD-10-CM

## 2024-02-22 DIAGNOSIS — Z0283 Encounter for blood-alcohol and blood-drug test: Secondary | ICD-10-CM

## 2024-02-22 DIAGNOSIS — Z3483 Encounter for supervision of other normal pregnancy, third trimester: Secondary | ICD-10-CM

## 2024-02-22 MED ORDER — NIFEDIPINE ER OSMOTIC RELEASE 60 MG PO TB24: 60.0000 mg | ORAL_TABLET | Freq: Every day | ORAL | 6 refills | Status: AC

## 2024-02-22 NOTE — Progress Notes (Unsigned)
 error

## 2024-02-22 NOTE — Progress Notes (Signed)
" ° ° °  Return Prenatal Note   Subjective   24 y.o. G2P0101 at [redacted]w[redacted]d presents for this follow-up prenatal visit.  Patient doing well, feeling well.  Patient reports: Movement: Present Contractions: Irritability She denies headaches, visual disturbance, epigastric pain   Objective   Flow sheet Vitals: Pulse Rate: (!) 101 BP: (!) 167/93 Total weight gain: 16 lb 8 oz (7.484 kg) Body mass index is 44.09 kg/m.  General Appearance  No acute distress, well appearing, and well nourished Pulmonary   Normal work of breathing Neurologic   Alert and oriented to person, place, and time Psychiatric   Mood and affect within normal limits  Reflexes +2 bilaterally Negative clonus  Fetus A Non-Stress  Test Interpretation for 02/23/24  Indication: Chronic Hypertension  Fetal Heart Rate A Baseline Rate (A): 150 bpm Variability: Minimal Accelerations: 10 x 10 Decelerations: None  Uterine Activity Mode: Toco  Interpretation (Fetal Testing) Nonstress Test Interpretation: Reactive Overall Impression: Reassuring for gestational age    Assessment/Plan   Plan  24 y.o. G2P0101 at [redacted]w[redacted]d presents for follow-up OB visit. Reviewed prenatal record including previous visit note.  No problem-specific Assessment & Plan notes found for this encounter.      Orders Placed This Encounter  Procedures   US  OB Follow Up    Standing Status:   Future    Expected Date:   03/14/2024    Expiration Date:   02/21/2025    Reason for Exam (SYMPTOM  OR DIAGNOSIS REQUIRED):   growth ultrasound    Preferred imaging location?:   Internal   CBC   Comprehensive metabolic panel with GFR   Protein / creatinine ratio, urine   Nicotine  screen, urine   Monitor Drug Profile 14(MW)   Fetal nonstress test    Standing Status:   Future    Expiration Date:   02/21/2025   No follow-ups on file.   Future Appointments  Date Time Provider Department Center  03/01/2024 10:15 AM AOB-NST ROOM [facility] None  03/01/2024  10:35 AM Dominic, Jinnie Jansky, CNM [facility] None  03/14/2024  2:00 PM [facility] US  1 [facility] None    Pre E labs collected today. Will follow up with results. Increased procardia  XL 60  mg daily .  For next visit:  ROB with NST     Zelda Hummer, CNM  02/06/262:02 PM  "

## 2024-02-23 LAB — COMPREHENSIVE METABOLIC PANEL WITH GFR
ALT: 10 [IU]/L (ref 0–32)
AST: 15 [IU]/L (ref 0–40)
Albumin: 3.8 g/dL — ABNORMAL LOW (ref 4.0–5.0)
Alkaline Phosphatase: 203 [IU]/L — ABNORMAL HIGH (ref 41–116)
BUN/Creatinine Ratio: 10 (ref 9–23)
BUN: 5 mg/dL — ABNORMAL LOW (ref 6–20)
Bilirubin Total: 0.2 mg/dL (ref 0.0–1.2)
CO2: 15 mmol/L — ABNORMAL LOW (ref 20–29)
Calcium: 9.2 mg/dL (ref 8.7–10.2)
Chloride: 104 mmol/L (ref 96–106)
Creatinine, Ser: 0.52 mg/dL — ABNORMAL LOW (ref 0.57–1.00)
Globulin, Total: 2.8 g/dL (ref 1.5–4.5)
Glucose: 113 mg/dL — ABNORMAL HIGH (ref 70–99)
Potassium: 3.8 mmol/L (ref 3.5–5.2)
Sodium: 134 mmol/L (ref 134–144)
Total Protein: 6.6 g/dL (ref 6.0–8.5)
eGFR: 134 mL/min/{1.73_m2}

## 2024-02-23 LAB — CBC
Hematocrit: 35.3 % (ref 34.0–46.6)
Hemoglobin: 11.5 g/dL (ref 11.1–15.9)
MCH: 28.7 pg (ref 26.6–33.0)
MCHC: 32.6 g/dL (ref 31.5–35.7)
MCV: 88 fL (ref 79–97)
Platelets: 272 10*3/uL (ref 150–450)
RBC: 4.01 x10E6/uL (ref 3.77–5.28)
RDW: 13.1 % (ref 11.7–15.4)
WBC: 10.4 10*3/uL (ref 3.4–10.8)

## 2024-02-23 LAB — PROTEIN / CREATININE RATIO, URINE
Creatinine, Urine: 156.7 mg/dL
Protein, Ur: 33 mg/dL
Protein/Creat Ratio: 211 mg/g{creat} — ABNORMAL HIGH (ref 0–200)

## 2024-02-26 ENCOUNTER — Encounter: Admitting: Obstetrics

## 2024-03-01 ENCOUNTER — Encounter: Admitting: Licensed Practical Nurse

## 2024-03-01 ENCOUNTER — Other Ambulatory Visit

## 2024-03-14 ENCOUNTER — Other Ambulatory Visit
